# Patient Record
Sex: Female | Born: 1965 | Race: Black or African American | Hispanic: No | Marital: Married | Smoking: Never smoker
Health system: Southern US, Community
[De-identification: ages and names within clinical notes are randomized; demographics above are authoritative.]

## PROBLEM LIST (undated history)

## (undated) DIAGNOSIS — I10 Essential (primary) hypertension: Secondary | ICD-10-CM

## (undated) HISTORY — PX: TONSILLECTOMY: SUR1361

## (undated) HISTORY — PX: APPENDECTOMY: SHX54

---

## 2008-12-17 ENCOUNTER — Emergency Department (HOSPITAL_COMMUNITY): Admission: EM | Admit: 2008-12-17 | Discharge: 2008-12-17 | Payer: Self-pay | Admitting: Emergency Medicine

## 2010-05-30 ENCOUNTER — Emergency Department (HOSPITAL_BASED_OUTPATIENT_CLINIC_OR_DEPARTMENT_OTHER)
Admission: EM | Admit: 2010-05-30 | Discharge: 2010-05-30 | Disposition: A | Discharge status: Home / Self Care | Payer: Self-pay | Attending: Emergency Medicine | Admitting: Emergency Medicine

## 2010-05-30 ENCOUNTER — Emergency Department (INDEPENDENT_AMBULATORY_CARE_PROVIDER_SITE_OTHER): Payer: Self-pay

## 2010-05-30 DIAGNOSIS — R6883 Chills (without fever): Secondary | ICD-10-CM

## 2010-05-30 DIAGNOSIS — R0989 Other specified symptoms and signs involving the circulatory and respiratory systems: Secondary | ICD-10-CM

## 2010-05-30 DIAGNOSIS — J069 Acute upper respiratory infection, unspecified: Secondary | ICD-10-CM | POA: Insufficient documentation

## 2010-05-30 DIAGNOSIS — R509 Fever, unspecified: Secondary | ICD-10-CM | POA: Insufficient documentation

## 2010-05-30 DIAGNOSIS — R05 Cough: Secondary | ICD-10-CM

## 2010-05-30 DIAGNOSIS — I10 Essential (primary) hypertension: Secondary | ICD-10-CM | POA: Insufficient documentation

## 2010-05-30 LAB — URINALYSIS, ROUTINE W REFLEX MICROSCOPIC
Bilirubin Urine: NEGATIVE
Glucose, UA: NEGATIVE mg/dL
Specific Gravity, Urine: 1.018 (ref 1.005–1.030)
pH: 6 (ref 5.0–8.0)

## 2010-05-30 LAB — URINE MICROSCOPIC-ADD ON

## 2013-07-05 ENCOUNTER — Emergency Department (HOSPITAL_BASED_OUTPATIENT_CLINIC_OR_DEPARTMENT_OTHER)
Admission: EM | Admit: 2013-07-05 | Discharge: 2013-07-05 | Disposition: A | Discharge status: Home / Self Care | Payer: BC Managed Care – PPO | Attending: Emergency Medicine | Admitting: Emergency Medicine

## 2013-07-05 ENCOUNTER — Encounter (HOSPITAL_BASED_OUTPATIENT_CLINIC_OR_DEPARTMENT_OTHER): Payer: Self-pay | Admitting: Emergency Medicine

## 2013-07-05 ENCOUNTER — Emergency Department (HOSPITAL_BASED_OUTPATIENT_CLINIC_OR_DEPARTMENT_OTHER): Payer: BC Managed Care – PPO

## 2013-07-05 DIAGNOSIS — R51 Headache: Secondary | ICD-10-CM | POA: Insufficient documentation

## 2013-07-05 DIAGNOSIS — Z79899 Other long term (current) drug therapy: Secondary | ICD-10-CM | POA: Insufficient documentation

## 2013-07-05 DIAGNOSIS — R0789 Other chest pain: Secondary | ICD-10-CM

## 2013-07-05 DIAGNOSIS — R509 Fever, unspecified: Secondary | ICD-10-CM | POA: Insufficient documentation

## 2013-07-05 DIAGNOSIS — R071 Chest pain on breathing: Secondary | ICD-10-CM | POA: Insufficient documentation

## 2013-07-05 DIAGNOSIS — I10 Essential (primary) hypertension: Secondary | ICD-10-CM | POA: Insufficient documentation

## 2013-07-05 DIAGNOSIS — J209 Acute bronchitis, unspecified: Secondary | ICD-10-CM | POA: Insufficient documentation

## 2013-07-05 HISTORY — DX: Essential (primary) hypertension: I10

## 2013-07-05 MED ORDER — PHENYLEPH-PROMETHAZINE-COD 5-6.25-10 MG/5ML PO SYRP: 5.0000 mL | ORAL_SOLUTION | Freq: Three times a day (TID) | ORAL | Status: DC

## 2013-07-05 MED ORDER — AZITHROMYCIN 250 MG PO TABS: 250.0000 mg | ORAL_TABLET | Freq: Every day | ORAL | Status: DC

## 2013-07-05 NOTE — ED Notes (Signed)
Pt denies HA or dizziness. Sts she has not had her BP med yet today but will take it.

## 2013-07-05 NOTE — Discharge Instructions (Signed)
Acute Bronchitis °Bronchitis is inflammation of the airways that extend from the windpipe into the lungs (bronchi). The inflammation often causes mucus to develop. This leads to a cough, which is the most common symptom of bronchitis.  °In acute bronchitis, the condition usually develops suddenly and goes away over time, usually in a couple weeks. Smoking, allergies, and asthma can make bronchitis worse. Repeated episodes of bronchitis may cause further lung problems.  °CAUSES °Acute bronchitis is most often caused by the same virus that causes a cold. The virus can spread from person to person (contagious).  °SIGNS AND SYMPTOMS  °· Cough.   °· Fever.   °· Coughing up mucus.   °· Body aches.   °· Chest congestion.   °· Chills.   °· Shortness of breath.   °· Sore throat.   °DIAGNOSIS  °Acute bronchitis is usually diagnosed through a physical exam. Tests, such as chest X-rays, are sometimes done to rule out other conditions.  °TREATMENT  °Acute bronchitis usually goes away in a couple weeks. Often times, no medical treatment is necessary. Medicines are sometimes given for relief of fever or cough. Antibiotics are usually not needed but may be prescribed in certain situations. In some cases, an inhaler may be recommended to help reduce shortness of breath and control the cough. A cool mist vaporizer may also be used to help thin bronchial secretions and make it easier to clear the chest.  °HOME CARE INSTRUCTIONS °· Get plenty of rest.   °· Drink enough fluids to keep your urine clear or pale yellow (unless you have a medical condition that requires fluid restriction). Increasing fluids may help thin your secretions and will prevent dehydration.   °· Only take over-the-counter or prescription medicines as directed by your health care provider.   °· Avoid smoking and secondhand smoke. Exposure to cigarette smoke or irritating chemicals will make bronchitis worse. If you are a smoker, consider using nicotine gum or skin  patches to help control withdrawal symptoms. Quitting smoking will help your lungs heal faster.   °· Reduce the chances of another bout of acute bronchitis by washing your hands frequently, avoiding people with cold symptoms, and trying not to touch your hands to your mouth, nose, or eyes.   °· Follow up with your health care provider as directed.   °SEEK MEDICAL CARE IF: °Your symptoms do not improve after 1 week of treatment.  °SEEK IMMEDIATE MEDICAL CARE IF: °· You develop an increased fever or chills.   °· You have chest pain.   °· You have severe shortness of breath. °· You have bloody sputum.   °· You develop dehydration. °· You develop fainting. °· You develop repeated vomiting. °· You develop a severe headache. °MAKE SURE YOU:  °· Understand these instructions. °· Will watch your condition. °· Will get help right away if you are not doing well or get worse. °Document Released: 01/28/2004 Document Revised: 08/22/2012 Document Reviewed: 06/12/2012 °[employer]® Patient Information ©2015 [employer]. This information is not intended to replace advice given to you by your health care provider. Make sure you discuss any questions you have with your health care provider. ° °

## 2013-07-05 NOTE — ED Provider Notes (Signed)
CSN: [id number]     Arrival date & time 07/05/13  1129 History   First MD Initiated Contact with Patient 07/05/13 1233     Chief Complaint  Patient presents with  . Chest Pain     (Consider location/radiation/quality/duration/timing/severity/associated sxs/prior Treatment) Patient is a 48 y.o. female presenting with chest pain. The history is provided by the patient.  Chest Pain Pain location:  L chest Pain quality: aching and sharp   Pain radiates to:  Does not radiate Pain radiates to the back: no   Onset quality:  Sudden Duration:  6 hours Timing:  Intermittent Progression:  Unchanged Chronicity:  New Context: movement   Relieved by:  None tried Worsened by:  Deep breathing, movement and certain positions Ineffective treatments:  None tried Associated symptoms: cough, fatigue, fever and headache   Associated symptoms: no abdominal pain, no altered mental status, no anorexia, no anxiety, no back pain, no claudication, no diaphoresis, no dizziness, no dysphagia, no heartburn, no nausea, no near-syncope, no numbness, no palpitations, not vomiting and no weakness  Shortness of breath: with cough.   Risk factors: hypertension   Risk factors: no birth control, no coronary artery disease, no diabetes mellitus, no high cholesterol, no prior DVT/PE and no smoking    Gar GibbonYvette Kinser is a 48 y.o. female who presents to the ED with cough, congestion and chest pain. The cough and congestion started 5 days ago and has gotten worse. Today she noted chest pain that is worse with cough, movement and deep breath. She reports a lot of sinus congestion.  Past Medical History  Diagnosis Date  . Hypertension    Past Surgical History  Procedure Laterality Date  . Appendectomy    . Tonsillectomy     No family history on file. History  Substance Use Topics  . Smoking status: Never Smoker   . Smokeless tobacco: Not on file  . Alcohol Use: No   OB History   Grav Para Term Preterm Abortions TAB  SAB Ect Mult Living                 Review of Systems  Constitutional: Positive for fever and fatigue. Negative for diaphoresis.  HENT: Negative for trouble swallowing.   Respiratory: Positive for cough. Shortness of breath: with cough.   Cardiovascular: Positive for chest pain. Negative for palpitations, claudication and near-syncope.  Gastrointestinal: Negative for heartburn, nausea, vomiting, abdominal pain and anorexia.  Musculoskeletal: Negative for back pain.  Neurological: Positive for headaches. Negative for dizziness, weakness and numbness.      Allergies  Review of patient's allergies indicates no known allergies.  Home Medications   Prior to Admission medications   Medication Sig Start Date End Date Taking? Authorizing Provider  lisinopril-hydrochlorothiazide (PRINZIDE,ZESTORETIC) 10-12.5 MG per tablet Take 1 tablet by mouth daily.   Yes Historical Provider, MD   BP 221/89  Pulse 80  Temp(Src) 98.2 F (36.8 C) (Oral)  Resp 18  Ht 5\' 7"  (1.702 m)  Wt 203 lb (92.08 kg)  BMI 31.79 kg/m2  SpO2 98% Physical Exam  Nursing note and vitals reviewed. Constitutional: She is oriented to person, place, and time. She appears well-developed and well-nourished.  HENT:  Head: Normocephalic.  Right Ear: Tympanic membrane is retracted.  Left Ear: Tympanic membrane is retracted.  Nose: Right sinus exhibits maxillary sinus tenderness. Left sinus exhibits maxillary sinus tenderness.  Mouth/Throat: Uvula is midline, oropharynx is clear and moist and mucous membranes are normal.  Eyes: Conjunctivae and EOM are  normal. Pupils are equal, round, and reactive to light.  Neck: Neck supple.  Cardiovascular: Normal rate, regular rhythm and normal heart sounds.   Pulmonary/Chest: Effort normal. She has decreased breath sounds. She has no wheezes. She has no rales. She exhibits tenderness. She exhibits no mass and no crepitus.    Tender with palpation over the left anterior rib area.     Abdominal: Soft. There is no tenderness.  Musculoskeletal: Normal range of motion.  Neurological: She is alert and oriented to person, place, and time. No cranial nerve deficit.  Skin: Skin is warm and dry.  Psychiatric: She has a normal mood and affect. Her behavior is normal.    ED Course: discussed with Dr. Anitra LauthPlunkett  Procedures  Dg Chest 2 View  07/05/2013   CLINICAL DATA:  Left chest pain and mild shortness of breath.  EXAM: CHEST  2 VIEW  COMPARISON:  05/30/2010.  FINDINGS: Poor inspiration. Borderline enlarged cardiac silhouette, magnified by the poor inspiration. Clear lungs with normal vascularity. Mild central peribronchial thickening with mild progression. Mild scoliosis.  IMPRESSION: Mild bronchitic changes with mild progression.   Electronically Signed   By: Gordan PaymentSteve  Reid M.D.   On: 07/05/2013 13:13   EKG Interpretation  Date/Time:  Friday July 05 2013 11:41:06 EDT Ventricular Rate:  66 PR Interval:  188 QRS Duration: 82 QT Interval:  466 QTC Calculation: 488 R Axis:   -22 Text Interpretation:  Normal sinus rhythm Minimal voltage criteria for LVH, may be normal variant Prolonged QT No previous tracing Confirmed by Anitra LauthPLUNKETT  MD, WHITNEY ([id number]) on 07/05/2013 11:45:19 AM .  MDM  48 y.o. female with chest wall pain after having cough and congestion past several days. Stable for discharge without increased risk for PE, no previous hx of PE or DVT, no OC's, nonsmoker, no recent travels. Will treat for bronchitis and chest wall pain. She will return if symptoms worsen.  Discussed with the patient and all questioned fully answered.    Medication List    TAKE these medications       azithromycin 250 MG tablet  Commonly known as:  ZITHROMAX  Take 1 tablet (250 mg total) by mouth daily. Take first 2 tablets together, then 1 every day until finished.     Phenyleph-Promethazine-Cod 5-6.25-10 MG/5ML Syrp  Take 5 mLs by mouth 3 (three) times daily.      ASK your doctor about these  medications       lisinopril-hydrochlorothiazide 10-12.5 MG per tablet  Commonly known as:  PRINZIDE,ZESTORETIC  Take 1 tablet by mouth daily.          [facility] Orlene OchM Neese, [place]NP 07/06/13 [phone]

## 2013-07-05 NOTE — ED Notes (Signed)
Pt c/o a pulling sensation in her left chest that began this morning. Pain is worse with movement and inspiration. Some SOB but pt has had sinus congestion and pressure for several days also.

## 2013-07-07 NOTE — ED Provider Notes (Signed)
Medical screening examination/treatment/procedure(s) were performed by non-physician practitioner and as supervising physician I was immediately available for consultation/collaboration.   EKG Interpretation   Date/Time:  Friday July 05 2013 11:41:06 EDT Ventricular Rate:  66 PR Interval:  188 QRS Duration: 82 QT Interval:  466 QTC Calculation: 488 R Axis:   -22 Text Interpretation:  Normal sinus rhythm Minimal voltage criteria for  LVH, may be normal variant Prolonged QT No previous tracing Confirmed by  Anitra LauthPLUNKETT  MD, Maniah Nading ([id number]) on 07/05/2013 11:45:19 AM        Gwyneth SproutWhitney Marcellino Fidalgo, MD 07/07/13 1203

## 2016-12-13 ENCOUNTER — Other Ambulatory Visit: Payer: Self-pay

## 2016-12-13 ENCOUNTER — Emergency Department (HOSPITAL_BASED_OUTPATIENT_CLINIC_OR_DEPARTMENT_OTHER)
Admission: EM | Admit: 2016-12-13 | Discharge: 2016-12-13 | Disposition: A | Discharge status: Home / Self Care | Payer: Self-pay | Attending: Emergency Medicine | Admitting: Emergency Medicine

## 2016-12-13 ENCOUNTER — Encounter (HOSPITAL_BASED_OUTPATIENT_CLINIC_OR_DEPARTMENT_OTHER): Payer: Self-pay

## 2016-12-13 DIAGNOSIS — I10 Essential (primary) hypertension: Secondary | ICD-10-CM | POA: Insufficient documentation

## 2016-12-13 DIAGNOSIS — J069 Acute upper respiratory infection, unspecified: Secondary | ICD-10-CM | POA: Insufficient documentation

## 2016-12-13 LAB — RAPID STREP SCREEN (MED CTR MEBANE ONLY): Streptococcus, Group A Screen (Direct): NEGATIVE

## 2016-12-13 MED ORDER — KETOROLAC TROMETHAMINE 30 MG/ML IJ SOLN
30.0000 mg | Freq: Once | INTRAMUSCULAR | Status: AC
  Administered 2016-12-13: 30 mg via INTRAMUSCULAR
  Filled 2016-12-13: qty 1

## 2016-12-13 NOTE — ED Provider Notes (Signed)
[facility] EMERGENCY DEPARTMENT Provider Note   CSN: [id number] Arrival date & time: 12/13/16  1559     History   Chief Complaint Chief Complaint  Patient presents with  . Nasal Congestion    HPI Gar GibbonYvette Lowe is a 51 y.o. female.  Patient reports nasal congestion, sinus pressure, malaise, intermittent fever since Friday. She has tried OTC meds without significant relief. She has been around several sick contacts at school.   The history is provided by the patient. No language interpreter was used.  URI   This is a new problem. The current episode started more than 2 days ago. The problem has been gradually worsening. The maximum temperature recorded prior to her arrival was 102 to 102.9 F. The fever has been present for 1 to 2 days. Associated symptoms include diarrhea, nausea, congestion, sinus pain and sore throat. Pertinent negatives include no rash. She has tried NSAIDs and steam for the symptoms. The treatment provided no relief.    Past Medical History:  Diagnosis Date  . Hypertension     There are no active problems to display for this patient.   Past Surgical History:  Procedure Laterality Date  . APPENDECTOMY    . TONSILLECTOMY      OB History    No data available       Home Medications    Prior to Admission medications   Medication Sig Start Date End Date Taking? Authorizing Provider  lisinopril-hydrochlorothiazide (PRINZIDE,ZESTORETIC) 10-12.5 MG per tablet Take 1 tablet by mouth daily.    [provider]    Family History No family history on file.  Social History Social History   Tobacco Use  . Smoking status: Never Smoker  . Smokeless tobacco: Never Used  Substance Use Topics  . Alcohol use: No  . Drug use: No     Allergies   Patient has no known allergies.   Review of Systems Review of Systems  HENT: Positive for congestion, sinus pain and sore throat.   Gastrointestinal: Positive for diarrhea and nausea.   Musculoskeletal: Positive for myalgias.  Skin: Negative for rash.  All other systems reviewed and are negative.    Physical Exam Updated Vital Signs BP (!) 171/103 (BP Location: Right Arm) Comment (BP Location): Has not taken her bp med today.  Has taken cold medicines.  Pulse 70   Temp 98.3 F (36.8 C) (Oral)   Resp 20   Ht 5\' 7"  (1.702 m)   Wt 91.2 kg (201 lb)   SpO2 98%   BMI 31.48 kg/m   Physical Exam  Constitutional: She is oriented to person, place, and time. She appears well-developed and well-nourished.  HENT:  Right Ear: Tympanic membrane normal.  Left Ear: Tympanic membrane is erythematous.  Mouth/Throat: Uvula is midline and mucous membranes are normal. Posterior oropharyngeal erythema present. No tonsillar exudate.  Eyes: Conjunctivae are normal.  Neck: Neck supple.  Cardiovascular: Normal rate and regular rhythm.  Pulmonary/Chest: Effort normal and breath sounds normal.  Abdominal: Soft. Bowel sounds are normal.  Musculoskeletal: Normal range of motion.  Lymphadenopathy:    She has no cervical adenopathy.  Neurological: She is alert and oriented to person, place, and time.  Skin: Skin is warm and dry.  Psychiatric: She has a normal mood and affect.  Nursing note and vitals reviewed.    ED Treatments / Results  Labs (all labs ordered are listed, but only abnormal results are displayed) Labs Reviewed  RAPID STREP SCREEN (NOT AT [facility])  CULTURE, GROUP A STREP [facility])    EKG  EKG Interpretation None       Radiology No results found.  Procedures Procedures (including critical care time)  Medications Ordered in ED Medications  ketorolac (TORADOL) 30 MG/ML injection 30 mg (not administered)     Initial Impression / Assessment and Plan / ED Course  I have reviewed the triage vital signs and the nursing notes.  Pertinent labs & imaging results that were available during my care of the patient were reviewed by me and considered in my medical  decision making (see chart for details).     Pt symptoms consistent with URI. Pt will be discharged with symptomatic treatment.  Discussed return precautions.  Pt is hemodynamically stable & in NAD prior to discharge.  Final Clinical Impressions(s) / ED Diagnoses   Final diagnoses:  Viral upper respiratory tract infection    ED Discharge Orders    None       Felicie MornSmith, Addalyn Speedy, NP 12/13/16 2109    Gwyneth SproutPlunkett, Whitney, MD 12/14/16 [phone]

## 2016-12-13 NOTE — ED Notes (Signed)
ED Provider at bedside. 

## 2016-12-13 NOTE — ED Triage Notes (Signed)
C/o flu like sx x 5 days-no relief with OTC meds-NAD-steady gait

## 2016-12-13 NOTE — Discharge Instructions (Signed)
Your strep screen was negative. A confirmatory culture will be completed, with results in 2-3 days. You will be contacted if the culture indicates additional treatment is needed. Please refer to the attached discharge instructions. You may also want to try saline nasal spray to help with the sinus congestion.

## 2016-12-16 LAB — CULTURE, GROUP A STREP (THRC)

## 2017-05-11 ENCOUNTER — Other Ambulatory Visit: Payer: Self-pay

## 2017-05-11 ENCOUNTER — Encounter (HOSPITAL_BASED_OUTPATIENT_CLINIC_OR_DEPARTMENT_OTHER): Payer: Self-pay

## 2017-05-11 DIAGNOSIS — I10 Essential (primary) hypertension: Secondary | ICD-10-CM | POA: Insufficient documentation

## 2017-05-11 DIAGNOSIS — N3000 Acute cystitis without hematuria: Secondary | ICD-10-CM | POA: Diagnosis not present

## 2017-05-11 DIAGNOSIS — R300 Dysuria: Secondary | ICD-10-CM | POA: Diagnosis present

## 2017-05-11 LAB — URINALYSIS, ROUTINE W REFLEX MICROSCOPIC
BILIRUBIN URINE: NEGATIVE
GLUCOSE, UA: NEGATIVE mg/dL
Ketones, ur: NEGATIVE mg/dL
Nitrite: POSITIVE — AB
PROTEIN: NEGATIVE mg/dL
Specific Gravity, Urine: 1.02 (ref 1.005–1.030)
pH: 5.5 (ref 5.0–8.0)

## 2017-05-11 LAB — URINALYSIS, MICROSCOPIC (REFLEX)

## 2017-05-11 NOTE — ED Triage Notes (Signed)
C/o dysuria x 4 days-NAD-steady gait

## 2017-05-12 ENCOUNTER — Emergency Department (HOSPITAL_BASED_OUTPATIENT_CLINIC_OR_DEPARTMENT_OTHER)
Admission: EM | Admit: 2017-05-12 | Discharge: 2017-05-12 | Disposition: A | Discharge status: Home / Self Care | Payer: BLUE CROSS/BLUE SHIELD | Attending: Emergency Medicine | Admitting: Emergency Medicine

## 2017-05-12 DIAGNOSIS — N3000 Acute cystitis without hematuria: Secondary | ICD-10-CM

## 2017-05-12 LAB — CBC WITH DIFFERENTIAL/PLATELET
BASOS ABS: 0 10*3/uL (ref 0.0–0.1)
Basophils Relative: 1 %
Eosinophils Absolute: 0.2 10*3/uL (ref 0.0–0.7)
Eosinophils Relative: 2 %
HCT: 34.4 % — ABNORMAL LOW (ref 36.0–46.0)
HEMOGLOBIN: 11.6 g/dL — AB (ref 12.0–15.0)
LYMPHS PCT: 29 %
Lymphs Abs: 2.2 10*3/uL (ref 0.7–4.0)
MCH: 26.7 pg (ref 26.0–34.0)
MCHC: 33.7 g/dL (ref 30.0–36.0)
MCV: 79.1 fL (ref 78.0–100.0)
Monocytes Absolute: 0.7 10*3/uL (ref 0.1–1.0)
Monocytes Relative: 9 %
NEUTROS ABS: 4.5 10*3/uL (ref 1.7–7.7)
NEUTROS PCT: 59 %
Platelets: 293 10*3/uL (ref 150–400)
RBC: 4.35 MIL/uL (ref 3.87–5.11)
RDW: 14.3 % (ref 11.5–15.5)
WBC: 7.6 10*3/uL (ref 4.0–10.5)

## 2017-05-12 LAB — BASIC METABOLIC PANEL
ANION GAP: 8 (ref 5–15)
BUN: 21 mg/dL — ABNORMAL HIGH (ref 6–20)
CO2: 26 mmol/L (ref 22–32)
Calcium: 9.4 mg/dL (ref 8.9–10.3)
Chloride: 104 mmol/L (ref 101–111)
Creatinine, Ser: 0.7 mg/dL (ref 0.44–1.00)
GLUCOSE: 91 mg/dL (ref 65–99)
POTASSIUM: 3.5 mmol/L (ref 3.5–5.1)
Sodium: 138 mmol/L (ref 135–145)

## 2017-05-12 MED ORDER — SODIUM CHLORIDE 0.9 % IV SOLN
1.0000 g | Freq: Once | INTRAVENOUS | Status: AC
  Administered 2017-05-12: 1 g via INTRAVENOUS
  Filled 2017-05-12: qty 10

## 2017-05-12 MED ORDER — ACETAMINOPHEN 325 MG PO TABS
650.0000 mg | ORAL_TABLET | Freq: Once | ORAL | Status: AC
  Administered 2017-05-12: 650 mg via ORAL
  Filled 2017-05-12: qty 2

## 2017-05-12 MED ORDER — CEPHALEXIN 500 MG PO CAPS: 500.0000 mg | ORAL_CAPSULE | Freq: Four times a day (QID) | ORAL | 0 refills | Status: AC

## 2017-05-12 NOTE — ED Notes (Signed)
ED provider at the bedside.  

## 2017-05-12 NOTE — ED Provider Notes (Signed)
[facility] EMERGENCY DEPARTMENT Provider Note   CSN: [id number] Arrival date & time: 05/11/17  2252     History   Chief Complaint Chief Complaint  Patient presents with  . Dysuria    HPI Tina Lowe is a 52 y.o. female.  The history is provided by the patient.  Dysuria   This is a new problem. The current episode started more than 2 days ago. The problem occurs every urination. The problem has been gradually worsening. The quality of the pain is described as burning. The pain is moderate. The maximum temperature recorded prior to her arrival was 102 to 102.9 F. Associated symptoms include nausea. Pertinent negatives include no vomiting, no discharge and no flank pain. Treatments tried: AZO. Her past medical history does not include kidney stones, urological procedure or recurrent UTIs.   Patient reports onset of dysuria about 4 days ago.  She reports it is worsening.  She reports she originally had suprapubic pain, now it is worsening.  She reports fever today. She is never had a UTI before.  She has tried Azo, without relief Past Medical History:  Diagnosis Date  . Hypertension     There are no active problems to display for this patient.   Past Surgical History:  Procedure Laterality Date  . APPENDECTOMY    . TONSILLECTOMY       OB History   None      Home Medications    Prior to Admission medications   Medication Sig Start Date End Date Taking? Authorizing Provider  lisinopril-hydrochlorothiazide (PRINZIDE,ZESTORETIC) 10-12.5 MG per tablet Take 1 tablet by mouth daily.    [provider]    Family History No family history on file.  Social History Social History   Tobacco Use  . Smoking status: Never Smoker  . Smokeless tobacco: Never Used  Substance Use Topics  . Alcohol use: No  . Drug use: No     Allergies   Patient has no known allergies.   Review of Systems Review of Systems  Constitutional: Positive for fever.    Gastrointestinal: Positive for nausea. Negative for vomiting.  Genitourinary: Positive for dysuria. Negative for flank pain, vaginal bleeding and vaginal discharge.  All other systems reviewed and are negative.    Physical Exam Updated Vital Signs BP (!) 205/104 (BP Location: Left Arm)   Pulse 85   Temp 99.2 F (37.3 C) (Oral)   Resp 20   Ht 1.676 m ( )   Wt 93.4 kg (206 lb)   SpO2 98%   BMI 33.25 kg/m   Physical Exam CONSTITUTIONAL: Well developed/well nourished HEAD: Normocephalic/atraumatic EYES: EOMI/PERRL ENMT: Mucous membranes moist NECK: supple no meningeal signs SPINE/BACK:entire spine nontender CV: S1/S2 noted, no murmurs/rubs/gallops noted LUNGS: Lungs are clear to auscultation bilaterally, no apparent distress ABDOMEN: soft, diffuse mild tenderness, moderate suprapubic tenderness, no rebound or guarding, bowel sounds noted throughout abdomen GU:no cva tenderness NEURO: Pt is awake/alert/appropriate, moves all extremitiesx4.  No facial droop.   EXTREMITIES: pulses normal/equal, full ROM SKIN: warm, color normal PSYCH: no abnormalities of mood noted, alert and oriented to situation   ED Treatments / Results  Labs (all labs ordered are listed, but only abnormal results are displayed) Labs Reviewed  URINALYSIS, ROUTINE W REFLEX MICROSCOPIC - Abnormal; Notable for the following components:      Result Value   APPearance CLOUDY (*)    Hgb urine dipstick MODERATE (*)    Nitrite POSITIVE (*)    Leukocytes, UA SMALL (*)  All other components within normal limits  URINALYSIS, MICROSCOPIC (REFLEX) - Abnormal; Notable for the following components:   Bacteria, UA MANY (*)    All other components within normal limits  BASIC METABOLIC PANEL - Abnormal; Notable for the following components:   BUN 21 (*)    All other components within normal limits  CBC WITH DIFFERENTIAL/PLATELET - Abnormal; Notable for the following components:   Hemoglobin 11.6 (*)    HCT  34.4 (*)    All other components within normal limits    EKG None  Radiology No results found.  Procedures Procedures    Medications Ordered in ED Medications  acetaminophen (TYLENOL) tablet 650 mg (650 mg Oral Given 05/12/17 0300)  cefTRIAXone (ROCEPHIN) 1 g in sodium chloride 0.9 % 100 mL IVPB (0 g Intravenous Stopped 05/12/17 0336)     Initial Impression / Assessment and Plan / ED Course  I have reviewed the triage vital signs and the nursing notes.  Pertinent labs  results that were available during my care of the patient were reviewed by me and considered in my medical decision making (see chart for details).     2:57 AM Patient with UTI, now reporting fever and pain.  Will load with IV Rocephin, and check labs.  She requests Tylenol for pain 4:16 AM Patient improved, vitals improved, labs reassuring She now only has mild suprapubic  tenderness, no other focal tenderness Will treat for UTI. Discussed Strict return precautions Final Clinical Impressions(s) / ED Diagnoses   Final diagnoses:  Acute cystitis without hematuria    ED Discharge Orders        Ordered    cephALEXin (KEFLEX) 500 MG capsule  4 times daily     05/12/17 0411       Zadie Rhine, MD 05/12/17 [phone]

## 2017-11-05 ENCOUNTER — Emergency Department (HOSPITAL_BASED_OUTPATIENT_CLINIC_OR_DEPARTMENT_OTHER): Payer: BLUE CROSS/BLUE SHIELD

## 2017-11-05 ENCOUNTER — Emergency Department (HOSPITAL_BASED_OUTPATIENT_CLINIC_OR_DEPARTMENT_OTHER)
Admission: EM | Admit: 2017-11-05 | Discharge: 2017-11-05 | Disposition: A | Discharge status: Home / Self Care | Payer: BLUE CROSS/BLUE SHIELD | Attending: Emergency Medicine | Admitting: Emergency Medicine

## 2017-11-05 ENCOUNTER — Encounter (HOSPITAL_BASED_OUTPATIENT_CLINIC_OR_DEPARTMENT_OTHER): Payer: Self-pay | Admitting: Emergency Medicine

## 2017-11-05 ENCOUNTER — Other Ambulatory Visit: Payer: Self-pay

## 2017-11-05 DIAGNOSIS — R079 Chest pain, unspecified: Secondary | ICD-10-CM | POA: Diagnosis not present

## 2017-11-05 DIAGNOSIS — I10 Essential (primary) hypertension: Secondary | ICD-10-CM | POA: Insufficient documentation

## 2017-11-05 DIAGNOSIS — R0602 Shortness of breath: Secondary | ICD-10-CM | POA: Diagnosis not present

## 2017-11-05 DIAGNOSIS — Z79899 Other long term (current) drug therapy: Secondary | ICD-10-CM | POA: Diagnosis not present

## 2017-11-05 DIAGNOSIS — R0789 Other chest pain: Secondary | ICD-10-CM | POA: Insufficient documentation

## 2017-11-05 LAB — CBC
HCT: 37.6 % (ref 36.0–46.0)
Hemoglobin: 11.7 g/dL — ABNORMAL LOW (ref 12.0–15.0)
MCH: 25.4 pg — ABNORMAL LOW (ref 26.0–34.0)
MCHC: 31.1 g/dL (ref 30.0–36.0)
MCV: 81.6 fL (ref 80.0–100.0)
NRBC: 0 % (ref 0.0–0.2)
PLATELETS: 323 10*3/uL (ref 150–400)
RBC: 4.61 MIL/uL (ref 3.87–5.11)
RDW: 14.2 % (ref 11.5–15.5)
WBC: 5.5 10*3/uL (ref 4.0–10.5)

## 2017-11-05 LAB — TROPONIN I: Troponin I: <0.03 ng/mL (ref <0.03)

## 2017-11-05 LAB — BASIC METABOLIC PANEL
Anion gap: 10 (ref 5–15)
BUN: 20 mg/dL (ref 6–20)
CALCIUM: 10.3 mg/dL (ref 8.9–10.3)
CHLORIDE: 103 mmol/L (ref 98–111)
CO2: 27 mmol/L (ref 22–32)
Creatinine, Ser: 0.8 mg/dL (ref 0.44–1.00)
GFR calc Af Amer: >60 mL/min (ref >60)
GFR calc non Af Amer: >60 mL/min (ref >60)
Glucose, Bld: 98 mg/dL (ref 70–99)
POTASSIUM: 3.7 mmol/L (ref 3.5–5.1)
Sodium: 140 mmol/L (ref 135–145)

## 2017-11-05 MED ORDER — KETOROLAC TROMETHAMINE 30 MG/ML IJ SOLN
15.0000 mg | Freq: Once | INTRAMUSCULAR | Status: AC
  Administered 2017-11-05: 15 mg via INTRAVENOUS
  Filled 2017-11-05: qty 1

## 2017-11-05 MED ORDER — IBUPROFEN 600 MG PO TABS: 600.0000 mg | ORAL_TABLET | Freq: Four times a day (QID) | ORAL | 0 refills | Status: AC | PRN

## 2017-11-05 NOTE — ED Provider Notes (Signed)
[facility] EMERGENCY DEPARTMENT Provider Note   CSN: [id number] Arrival date & time: 11/05/17  0827     History   Chief Complaint Chief Complaint  Patient presents with  . Chest Pain    HPI Tina Lowe is a 52 y.o. female.  HPI 52 year old white female comes in with chief complaint of chest pain. Patient has history of hypertension.  She reports that upon her waking up she noted left-sided substernal chest pain.  Her pain is constant and worse with movement.  The pain is described as " something is pulling".  Patient also states that the pain is worse with deep inspiration and she has mild shortness of breath.   Patient denies any history of coronary artery disease.  She is not a smoker and does not use any drugs.  There is no family history of CAD. Pt has no hx of PE, DVT and denies any exogenous hormone (testosterone / estrogen) use, long distance travels or surgery in the past 6 weeks, active cancer, recent immobilization.   Past Medical History:  Diagnosis Date  . Hypertension     There are no active problems to display for this patient.   Past Surgical History:  Procedure Laterality Date  . APPENDECTOMY    . TONSILLECTOMY       OB History   None      Home Medications    Prior to Admission medications   Medication Sig Start Date End Date Taking? Authorizing Provider  cephALEXin (KEFLEX) 500 MG capsule Take 1 capsule (500 mg total) by mouth 4 (four) times daily. 05/12/17   Zadie Rhine, MD  lisinopril-hydrochlorothiazide (PRINZIDE,ZESTORETIC) 10-12.5 MG per tablet Take 1 tablet by mouth daily.    [provider]    Family History No family history on file.  Social History Social History   Tobacco Use  . Smoking status: Never Smoker  . Smokeless tobacco: Never Used  Substance Use Topics  . Alcohol use: No  . Drug use: No     Allergies   Patient has no known allergies.   Review of Systems Review of Systems    Constitutional: Positive for activity change.  Respiratory: Positive for shortness of breath.   Cardiovascular: Positive for chest pain.  Gastrointestinal: Negative for nausea and vomiting.  All other systems reviewed and are negative.    Physical Exam Updated Vital Signs BP (!) 189/80   Pulse 64   Temp 99.4 F (37.4 C) (Oral)   Resp 18   Ht 5\' 7"  (1.702 m)   Wt 93 kg   SpO2 100%   BMI 32.11 kg/m   Physical Exam  Constitutional: She is oriented to person, place, and time. She appears well-developed.  HENT:  Head: Normocephalic and atraumatic.  Eyes: EOM are normal.  Neck: Normal range of motion. Neck supple.  Cardiovascular: Normal rate, intact distal pulses and normal pulses.  Patient's chest wall tenderness is reproduced with forward flexion, pec fly and palpation of the substernal region.  Pulmonary/Chest: Effort normal. No tachypnea. No respiratory distress.  Abdominal: Bowel sounds are normal.  Neurological: She is alert and oriented to person, place, and time.  Skin: Skin is warm and dry.  Nursing note and vitals reviewed.    ED Treatments / Results  Labs (all labs ordered are listed, but only abnormal results are displayed) Labs Reviewed  CBC - Abnormal; Notable for the following components:      Result Value   Hemoglobin 11.7 (*)  MCH 25.4 (*)    All other components within normal limits  BASIC METABOLIC PANEL  TROPONIN I    EKG EKG Interpretation  Date/Time:  Sunday November 05 2017 08:34:47 EST Ventricular Rate:  84 PR Interval:    QRS Duration: 94 QT Interval:  421 QTC Calculation: 498 R Axis:   -14 Text Interpretation:  Sinus rhythm Borderline prolonged QT interval No acute changes Confirmed by Lynzee Lindquist ([id number]) on 11/05/2017 9:24:13 AM   Radiology Dg Chest 2 View  Result Date: 11/05/2017 CLINICAL DATA:  Pain under left breast since yesterday.  Chest pain. EXAM: CHEST - 2 VIEW COMPARISON:  Two-view chest x-ray 07/05/2013 FINDINGS:  The heart size is exaggerated by low lung volumes. Atherosclerotic changes are present at the aortic arch. There is no edema or effusion. No focal airspace disease is present. The visualized soft tissues and bony thorax are unremarkable. IMPRESSION: 1. Acute cardiopulmonary disease. 2. Low lung volumes. 3. Aortic atherosclerosis. Electronically Signed   By: Christopher  Mattern M.D.   On: 11/05/2017 09:15    Procedures Procedures (including critical care time)  Medications Ordered in ED Medications  ketorolac (TORADOL) 30 MG/ML injection 15 mg (has no administration in time range)     Initial Impression / Assessment and Plan / ED Course  I have reviewed the triage vital signs and the nursing notes.  Pertinent labs & imaging results that were available during my care of the patient were reviewed by me and considered in my medical decision making (see chart for details).     52  year old female comes in with chief complaint of chest pain.  History is consistent with what appears to be musculoskeletal or pleuritic chest pain. Delta troponin ordered to rule out ACS.  EKG is not showing any acute findings and the heart score is 2.  Given that the pain is reproducible with palpation and movement of the upper extremities, we will treat this pain conservatively as chest wall pain.  Patient has been made aware that pulmonary embolism can also produce pleuritic type pain, and that if her symptoms are getting worse despite anti-inflammatory medications and she will have to return to the ER.  Patient is in agreement with the plan for conservative management for now.   Final Clinical Impressions(s) / ED Diagnoses   Final diagnoses:  Chest wall pain  Nonspecific chest pain    ED Discharge Orders    None       Derwood Kaplan, MD 11/05/17 1025

## 2017-11-05 NOTE — ED Notes (Signed)
Pt/family verbalized understanding of discharge instructions.   

## 2017-11-05 NOTE — ED Triage Notes (Signed)
Pain under her L breast since yesterday. She states it feels like a pulled muscle, worse with movement

## 2017-11-05 NOTE — Discharge Instructions (Signed)
We saw you in the ER for the chest pain. All of our cardiac workup is normal, including labs, EKG and chest X-RAY are normal. We are not sure what is causing your discomfort, but we feel comfortable sending you home at this time. The workup in the ER is not complete, and you should follow up with your primary care doctor for further evaluation.  

## 2018-10-11 ENCOUNTER — Other Ambulatory Visit: Payer: Self-pay

## 2018-10-11 DIAGNOSIS — Z20822 Contact with and (suspected) exposure to covid-19: Secondary | ICD-10-CM

## 2018-10-13 LAB — NOVEL CORONAVIRUS, NAA: SARS-CoV-2, NAA: NOT DETECTED

## 2019-03-21 ENCOUNTER — Ambulatory Visit: Payer: BC Managed Care – PPO | Attending: Family

## 2019-03-21 DIAGNOSIS — Z23 Encounter for immunization: Secondary | ICD-10-CM

## 2019-03-21 NOTE — Progress Notes (Signed)
   Covid-19 Vaccination Clinic  Name:  Curtina Grills    MRN: [id number] DOB: 04-07-1965  03/21/2019  Ms. Whistler was observed post Covid-19 immunization for 15 minutes without incident. She was provided with Vaccine Information Sheet and instruction to access the V-Safe system.   Ms. Clippinger was instructed to call 911 with any severe reactions post vaccine: Marland Kitchen Difficulty breathing  . Swelling of face and throat  . A fast heartbeat  . A bad rash all over body  . Dizziness and weakness   Immunizations Administered    Name Date Dose VIS Date Route   Moderna COVID-19 Vaccine 03/21/2019  4:25 PM 0.5 mL 12/04/2018 Intramuscular   Manufacturer: Moderna   Lot: [id number]   NDC: 58682-574-93

## 2019-04-23 ENCOUNTER — Ambulatory Visit: Payer: BC Managed Care – PPO | Attending: Family

## 2019-04-23 DIAGNOSIS — Z23 Encounter for immunization: Secondary | ICD-10-CM

## 2019-04-23 NOTE — Progress Notes (Signed)
   Covid-19 Vaccination Clinic  Name:  Humaira Sculley    MRN: [id number] DOB: [birth year]  04/23/2019  Ms. Hunke was observed post Covid-19 immunization for 15 minutes without incident. She was provided with Vaccine Information Sheet and instruction to access the V-Safe system.   Ms. Sorci was instructed to call 911 with any severe reactions post vaccine: Marland Kitchen Difficulty breathing  . Swelling of face and throat  . A fast heartbeat  . A bad rash all over body  . Dizziness and weakness   Immunizations Administered    Name Date Dose VIS Date Route   Moderna COVID-19 Vaccine 04/23/2019  4:14 PM 0.5 mL 12/2018 Intramuscular   Manufacturer: Moderna   Lot: [id number]   NDC: 53794-327-61

## 2019-06-24 ENCOUNTER — Other Ambulatory Visit: Payer: Self-pay

## 2019-06-24 ENCOUNTER — Encounter (HOSPITAL_BASED_OUTPATIENT_CLINIC_OR_DEPARTMENT_OTHER): Payer: Self-pay | Admitting: *Deleted

## 2019-06-24 ENCOUNTER — Emergency Department (HOSPITAL_BASED_OUTPATIENT_CLINIC_OR_DEPARTMENT_OTHER)
Admission: EM | Admit: 2019-06-24 | Discharge: 2019-06-24 | Disposition: A | Discharge status: Home / Self Care | Payer: BC Managed Care – PPO | Attending: Emergency Medicine | Admitting: Emergency Medicine

## 2019-06-24 ENCOUNTER — Emergency Department (HOSPITAL_BASED_OUTPATIENT_CLINIC_OR_DEPARTMENT_OTHER): Payer: BC Managed Care – PPO

## 2019-06-24 DIAGNOSIS — N201 Calculus of ureter: Secondary | ICD-10-CM | POA: Diagnosis not present

## 2019-06-24 DIAGNOSIS — R109 Unspecified abdominal pain: Secondary | ICD-10-CM | POA: Diagnosis present

## 2019-06-24 DIAGNOSIS — Z79899 Other long term (current) drug therapy: Secondary | ICD-10-CM | POA: Diagnosis not present

## 2019-06-24 DIAGNOSIS — I10 Essential (primary) hypertension: Secondary | ICD-10-CM | POA: Diagnosis not present

## 2019-06-24 LAB — CBC
HCT: 37.5 % (ref 36.0–46.0)
Hemoglobin: 11.9 g/dL — ABNORMAL LOW (ref 12.0–15.0)
MCH: 25.3 pg — ABNORMAL LOW (ref 26.0–34.0)
MCHC: 31.7 g/dL (ref 30.0–36.0)
MCV: 79.6 fL — ABNORMAL LOW (ref 80.0–100.0)
Platelets: 357 10*3/uL (ref 150–400)
RBC: 4.71 MIL/uL (ref 3.87–5.11)
RDW: 14.4 % (ref 11.5–15.5)
WBC: 15.3 10*3/uL — ABNORMAL HIGH (ref 4.0–10.5)
nRBC: 0 % (ref 0.0–0.2)

## 2019-06-24 LAB — BASIC METABOLIC PANEL
Anion gap: 14 (ref 5–15)
BUN: 30 mg/dL — ABNORMAL HIGH (ref 6–20)
CO2: 24 mmol/L (ref 22–32)
Calcium: 9.8 mg/dL (ref 8.9–10.3)
Chloride: 97 mmol/L — ABNORMAL LOW (ref 98–111)
Creatinine, Ser: 1.07 mg/dL — ABNORMAL HIGH (ref 0.44–1.00)
GFR calc Af Amer: >60 mL/min (ref >60)
GFR calc non Af Amer: 58 mL/min — ABNORMAL LOW (ref >60)
Glucose, Bld: 140 mg/dL — ABNORMAL HIGH (ref 70–99)
Potassium: 3.3 mmol/L — ABNORMAL LOW (ref 3.5–5.1)
Sodium: 135 mmol/L (ref 135–145)

## 2019-06-24 LAB — URINALYSIS, MICROSCOPIC (REFLEX): RBC / HPF: >50 RBC/hpf (ref 0–5)

## 2019-06-24 LAB — URINALYSIS, ROUTINE W REFLEX MICROSCOPIC
Bilirubin Urine: NEGATIVE
Glucose, UA: NEGATIVE mg/dL
Ketones, ur: NEGATIVE mg/dL
Nitrite: NEGATIVE
Protein, ur: NEGATIVE mg/dL
Specific Gravity, Urine: 1.02 (ref 1.005–1.030)
pH: 6 (ref 5.0–8.0)

## 2019-06-24 MED ORDER — KETOROLAC TROMETHAMINE 30 MG/ML IJ SOLN
30.0000 mg | Freq: Once | INTRAMUSCULAR | Status: AC
  Administered 2019-06-24: 30 mg via INTRAVENOUS
  Filled 2019-06-24: qty 1

## 2019-06-24 MED ORDER — HYDROMORPHONE HCL 1 MG/ML IJ SOLN
1.0000 mg | Freq: Once | INTRAMUSCULAR | Status: AC
  Administered 2019-06-24: 1 mg via INTRAVENOUS
  Filled 2019-06-24: qty 1

## 2019-06-24 MED ORDER — ONDANSETRON HCL 4 MG/2ML IJ SOLN
4.0000 mg | Freq: Once | INTRAMUSCULAR | Status: AC
  Administered 2019-06-24: 4 mg via INTRAVENOUS
  Filled 2019-06-24: qty 2

## 2019-06-24 MED ORDER — ONDANSETRON 4 MG PO TBDP: 4.0000 mg | ORAL_TABLET | Freq: Three times a day (TID) | ORAL | 1 refills | Status: DC | PRN

## 2019-06-24 MED ORDER — HYDROCODONE-ACETAMINOPHEN 5-325 MG PO TABS: 1.0000 | ORAL_TABLET | Freq: Four times a day (QID) | ORAL | 0 refills | Status: DC | PRN

## 2019-06-24 NOTE — ED Triage Notes (Signed)
C/o diffuse lower abd pain x 2 days , urianry freq

## 2019-06-24 NOTE — ED Provider Notes (Signed)
[facility] EMERGENCY DEPARTMENT Provider Note   CSN: [id number] Arrival date & time: 06/24/19  1859     History Chief Complaint  Patient presents with  . Abdominal Pain    Tina Lowe is a 54 y.o. female.  Patient with onset of some mild left flank left lower abdominal pain around noon time.  Got lot more intense around 5 PM this evening.  Nauseated with the feeling of needing to go some urinary frequency and cloudy urine.  No nausea or vomiting.  Patient without any history of kidney stones in the past.  But her mother had a lot of kidney stones.        Past Medical History:  Diagnosis Date  . Hypertension     There are no problems to display for this patient.   Past Surgical History:  Procedure Laterality Date  . APPENDECTOMY    . TONSILLECTOMY       OB History   No obstetric history on file.     No family history on file.  Social History   Tobacco Use  . Smoking status: Never Smoker  . Smokeless tobacco: Never Used  Substance Use Topics  . Alcohol use: No  . Drug use: No    Home Medications Prior to Admission medications   Medication Sig Start Date End Date Taking? Authorizing Provider  cephALEXin (KEFLEX) 500 MG capsule Take 1 capsule (500 mg total) by mouth 4 (four) times daily. 05/12/17   Zadie Rhine, MD  HYDROcodone-acetaminophen (NORCO/VICODIN) 5-325 MG tablet Take 1 tablet by mouth every 6 (six) hours as needed for moderate pain. 06/24/19   Vanetta Mulders, MD  ibuprofen (ADVIL,MOTRIN) 600 MG tablet Take 1 tablet (600 mg total) by mouth every 6 (six) hours as needed. 11/05/17   Derwood Kaplan, MD  lisinopril-hydrochlorothiazide (PRINZIDE,ZESTORETIC) 10-12.5 MG per tablet Take 1 tablet by mouth daily.    [provider]  ondansetron (ZOFRAN ODT) 4 MG disintegrating tablet Take 1 tablet (4 mg total) by mouth every 8 (eight) hours as needed. 06/24/19   Vanetta Mulders, MD    Allergies    Patient has no known  allergies.  Review of Systems   Review of Systems  Constitutional: Negative for chills and fever.  HENT: Negative for congestion, rhinorrhea and sore throat.   Eyes: Negative for visual disturbance.  Respiratory: Negative for cough and shortness of breath.   Cardiovascular: Negative for chest pain and leg swelling.  Gastrointestinal: Positive for abdominal pain. Negative for diarrhea, nausea and vomiting.  Genitourinary: Positive for dysuria and flank pain.  Musculoskeletal: Negative for back pain and neck pain.  Skin: Negative for rash.  Neurological: Negative for dizziness, light-headedness and headaches.  Hematological: Does not bruise/bleed easily.  Psychiatric/Behavioral: Negative for confusion.    Physical Exam Updated Vital Signs BP (!) 160/68 (BP Location: Left Arm)   Pulse 76   Temp 98.2 F (36.8 C) (Oral)   Resp 20   Ht 1.676 m (5\' 6" )   Wt 95.3 kg   LMP 06/24/2019   SpO2 99%   BMI 33.89 kg/m   Physical Exam Vitals and nursing note reviewed.  Constitutional:      General: She is not in acute distress.    Appearance: Normal appearance. She is well-developed.  HENT:     Head: Normocephalic and atraumatic.  Eyes:     Extraocular Movements: Extraocular movements intact.     Conjunctiva/sclera: Conjunctivae normal.     Pupils: Pupils are equal, round, and reactive  to light.  Cardiovascular:     Rate and Rhythm: Normal rate and regular rhythm.     Heart sounds: No murmur heard.   Pulmonary:     Effort: Pulmonary effort is normal. No respiratory distress.     Breath sounds: Normal breath sounds.  Abdominal:     Palpations: Abdomen is soft.     Tenderness: There is no abdominal tenderness.  Musculoskeletal:        General: Normal range of motion.     Cervical back: Normal range of motion and neck supple.  Skin:    General: Skin is warm and dry.     Capillary Refill: Capillary refill takes less than 2 seconds.  Neurological:     General: No focal deficit  present.     Mental Status: She is alert and oriented to person, place, and time.     Cranial Nerves: No cranial nerve deficit.     Sensory: No sensory deficit.     Motor: No weakness.     ED Results / Procedures / Treatments   Labs (all labs ordered are listed, but only abnormal results are displayed) Labs Reviewed  URINALYSIS, ROUTINE W REFLEX MICROSCOPIC - Abnormal; Notable for the following components:      Result Value   APPearance CLOUDY (*)    Hgb urine dipstick LARGE (*)    Leukocytes,Ua TRACE (*)    All other components within normal limits  CBC - Abnormal; Notable for the following components:   WBC 15.3 (*)    Hemoglobin 11.9 (*)    MCV 79.6 (*)    MCH 25.3 (*)    All other components within normal limits  BASIC METABOLIC PANEL - Abnormal; Notable for the following components:   Potassium 3.3 (*)    Chloride 97 (*)    Glucose, Bld 140 (*)    BUN 30 (*)    Creatinine, Ser 1.07 (*)    GFR calc non Af Amer 58 (*)    All other components within normal limits  URINALYSIS, MICROSCOPIC (REFLEX) - Abnormal; Notable for the following components:   Bacteria, UA RARE (*)    All other components within normal limits    EKG None  Radiology CT Renal Stone Study  Result Date: 06/24/2019 CLINICAL DATA:  Acute lower abdominal pain. EXAM: CT ABDOMEN AND PELVIS WITHOUT CONTRAST TECHNIQUE: Multidetector CT imaging of the abdomen and pelvis was performed following the standard protocol without IV contrast. COMPARISON:  None. FINDINGS: Lower chest: No acute abnormality. Hepatobiliary: No gallstones or biliary dilatation is noted. Mildly nodular hepatic contours are noted inferiorly suggesting possible hepatic cirrhosis. No definite focal abnormality is seen on these unenhanced images. Pancreas: Unremarkable. No pancreatic ductal dilatation or surrounding inflammatory changes. Spleen: Normal in size without focal abnormality. Adrenals/Urinary Tract: Adrenal glands appear normal. Right  kidney and ureter are unremarkable. Minimal left hydroureteronephrosis is noted secondary to 5 mm calculus at the left ureterovesical junction. Urinary bladder is decompressed. Stomach/Bowel: Stomach appears normal. Status post appendectomy. There is no definite evidence of bowel obstruction or inflammation. Sigmoid diverticulosis is noted without inflammation. Vascular/Lymphatic: No significant vascular findings are present. No enlarged abdominal or pelvic lymph nodes. Reproductive: Uterus and bilateral adnexa are unremarkable. Other: Small fat containing right inguinal hernia is noted. No ascites is noted. Musculoskeletal: No acute or significant osseous findings. IMPRESSION: 1. Minimal left hydroureteronephrosis is noted secondary to 5 mm calculus at the left ureterovesical junction. 2. Mildly nodular hepatic contours are noted inferiorly suggesting possible  hepatic cirrhosis. 3. Sigmoid diverticulosis without inflammation. 4. Small fat containing right inguinal hernia. Electronically Signed   By: Lupita Raider M.D.   On: 06/24/2019 20:13    Procedures Procedures (including critical care time)  Medications Ordered in ED Medications  ondansetron (ZOFRAN) injection 4 mg (4 mg Intravenous Given 06/24/19 1956)  HYDROmorphone (DILAUDID) injection 1 mg (1 mg Intravenous Given 06/24/19 1957)  ketorolac (TORADOL) 30 MG/ML injection 30 mg (30 mg Intravenous Given 06/24/19 2118)    ED Course  I have reviewed the triage vital signs and the nursing notes.  Pertinent labs & imaging results that were available during my care of the patient were reviewed by me and considered in my medical decision making (see chart for details).    MDM Rules/Calculators/A&P                          Work-up shows evidence of 5 mm distal left ureter stone.  Urinalysis without evidence of infection.  Little bit of hydronephrosis.  There is a leukocytosis on the white blood cell count.  BUN and creatinine up a little bit at 30  and 1.07.  Renal function without significant abnormalities.  First time kidney stone.  Patient treated here with hydromorphone and Zofran improved.  We will give her a dose of Toradol as well.  Will have her follow-up with urology Zofran and hydrocodone as needed for pain at home.  Also stated that patient could get a lot of relief from medicines like Motrin and Aleve.   Final Clinical Impression(s) / ED Diagnoses Final diagnoses:  Left ureteral stone    Rx / DC Orders ED Discharge Orders         Ordered    HYDROcodone-acetaminophen (NORCO/VICODIN) 5-325 MG tablet  Every 6 hours PRN     Discontinue  Reprint     06/24/19 2144    ondansetron (ZOFRAN ODT) 4 MG disintegrating tablet  Every 8 hours PRN     Discontinue  Reprint     06/24/19 2144           Vanetta Mulders, MD 06/24/19 2147

## 2019-06-24 NOTE — Discharge Instructions (Signed)
Take the hydrocodone as needed for pain.  Also take the Zofran as needed for nausea.  Also will find that medications like Motrin or Aleve are very helpful for kidney stones.  Can appointment to follow-up with urology.  Return if not improving over the next couple days or for any new or worse symptoms.  Work note provided.

## 2019-06-24 NOTE — ED Notes (Signed)
Patient transported to CT 

## 2020-08-16 ENCOUNTER — Emergency Department (HOSPITAL_BASED_OUTPATIENT_CLINIC_OR_DEPARTMENT_OTHER)
Admission: EM | Admit: 2020-08-16 | Discharge: 2020-08-16 | Disposition: A | Discharge status: Home / Self Care | Payer: BC Managed Care – PPO | Attending: Emergency Medicine | Admitting: Emergency Medicine

## 2020-08-16 ENCOUNTER — Encounter (HOSPITAL_BASED_OUTPATIENT_CLINIC_OR_DEPARTMENT_OTHER): Payer: Self-pay

## 2020-08-16 ENCOUNTER — Emergency Department (HOSPITAL_BASED_OUTPATIENT_CLINIC_OR_DEPARTMENT_OTHER): Payer: BC Managed Care – PPO

## 2020-08-16 ENCOUNTER — Other Ambulatory Visit: Payer: Self-pay

## 2020-08-16 DIAGNOSIS — N23 Unspecified renal colic: Secondary | ICD-10-CM

## 2020-08-16 DIAGNOSIS — N132 Hydronephrosis with renal and ureteral calculous obstruction: Secondary | ICD-10-CM | POA: Diagnosis not present

## 2020-08-16 DIAGNOSIS — R109 Unspecified abdominal pain: Secondary | ICD-10-CM | POA: Diagnosis present

## 2020-08-16 DIAGNOSIS — Z79899 Other long term (current) drug therapy: Secondary | ICD-10-CM | POA: Insufficient documentation

## 2020-08-16 DIAGNOSIS — I10 Essential (primary) hypertension: Secondary | ICD-10-CM | POA: Diagnosis not present

## 2020-08-16 LAB — CBC WITH DIFFERENTIAL/PLATELET
Abs Immature Granulocytes: 0.05 10*3/uL (ref 0.00–0.07)
Basophils Absolute: 0.1 10*3/uL (ref 0.0–0.1)
Basophils Relative: 1 %
Eosinophils Absolute: 0.1 10*3/uL (ref 0.0–0.5)
Eosinophils Relative: 1 %
HCT: 37 % (ref 36.0–46.0)
Hemoglobin: 12.2 g/dL (ref 12.0–15.0)
Immature Granulocytes: 0 %
Lymphocytes Relative: 10 %
Lymphs Abs: 1.2 10*3/uL (ref 0.7–4.0)
MCH: 26.1 pg (ref 26.0–34.0)
MCHC: 33 g/dL (ref 30.0–36.0)
MCV: 79.2 fL — ABNORMAL LOW (ref 80.0–100.0)
Monocytes Absolute: 0.9 10*3/uL (ref 0.1–1.0)
Monocytes Relative: 7 %
Neutro Abs: 10.4 10*3/uL — ABNORMAL HIGH (ref 1.7–7.7)
Neutrophils Relative %: 81 %
Platelets: 369 10*3/uL (ref 150–400)
RBC: 4.67 MIL/uL (ref 3.87–5.11)
RDW: 14.5 % (ref 11.5–15.5)
WBC: 12.8 10*3/uL — ABNORMAL HIGH (ref 4.0–10.5)
nRBC: 0 % (ref 0.0–0.2)

## 2020-08-16 LAB — URINALYSIS, MICROSCOPIC (REFLEX)

## 2020-08-16 LAB — COMPREHENSIVE METABOLIC PANEL
ALT: 19 U/L (ref 0–44)
AST: 20 U/L (ref 15–41)
Albumin: 5.1 g/dL — ABNORMAL HIGH (ref 3.5–5.0)
Alkaline Phosphatase: 66 U/L (ref 38–126)
Anion gap: 11 (ref 5–15)
BUN: 35 mg/dL — ABNORMAL HIGH (ref 6–20)
CO2: 27 mmol/L (ref 22–32)
Calcium: 9.5 mg/dL (ref 8.9–10.3)
Chloride: 100 mmol/L (ref 98–111)
Creatinine, Ser: 1.06 mg/dL — ABNORMAL HIGH (ref 0.44–1.00)
GFR, Estimated: >60 mL/min (ref >60)
Glucose, Bld: 106 mg/dL — ABNORMAL HIGH (ref 70–99)
Potassium: 4 mmol/L (ref 3.5–5.1)
Sodium: 138 mmol/L (ref 135–145)
Total Bilirubin: 0.4 mg/dL (ref 0.3–1.2)
Total Protein: 8.2 g/dL — ABNORMAL HIGH (ref 6.5–8.1)

## 2020-08-16 LAB — URINALYSIS, ROUTINE W REFLEX MICROSCOPIC
Bilirubin Urine: NEGATIVE
Glucose, UA: NEGATIVE mg/dL
Ketones, ur: NEGATIVE mg/dL
Nitrite: NEGATIVE
Protein, ur: NEGATIVE mg/dL
Specific Gravity, Urine: >1.03 — ABNORMAL HIGH (ref 1.005–1.030)
pH: 5.5 (ref 5.0–8.0)

## 2020-08-16 LAB — LIPASE, BLOOD: Lipase: 27 U/L (ref 11–51)

## 2020-08-16 MED ORDER — HYDROCODONE-ACETAMINOPHEN 5-325 MG PO TABS: 1.0000 | ORAL_TABLET | Freq: Four times a day (QID) | ORAL | 0 refills | Status: AC | PRN

## 2020-08-16 MED ORDER — ONDANSETRON HCL 4 MG/2ML IJ SOLN
4.0000 mg | Freq: Once | INTRAMUSCULAR | Status: AC
  Administered 2020-08-16: 4 mg via INTRAVENOUS
  Filled 2020-08-16: qty 2

## 2020-08-16 MED ORDER — SODIUM CHLORIDE 0.9 % IV BOLUS
1000.0000 mL | Freq: Once | INTRAVENOUS | Status: AC
  Administered 2020-08-16: 1000 mL via INTRAVENOUS

## 2020-08-16 MED ORDER — TAMSULOSIN HCL 0.4 MG PO CAPS: 0.4000 mg | ORAL_CAPSULE | Freq: Every day | ORAL | 0 refills | Status: DC

## 2020-08-16 MED ORDER — KETOROLAC TROMETHAMINE 30 MG/ML IJ SOLN
30.0000 mg | Freq: Once | INTRAMUSCULAR | Status: AC
  Administered 2020-08-16: 30 mg via INTRAVENOUS
  Filled 2020-08-16: qty 1

## 2020-08-16 MED ORDER — SODIUM CHLORIDE 0.9 % IV SOLN
1.0000 g | Freq: Once | INTRAVENOUS | Status: DC
  Filled 2020-08-16: qty 10

## 2020-08-16 MED ORDER — MORPHINE SULFATE (PF) 4 MG/ML IV SOLN
4.0000 mg | Freq: Once | INTRAVENOUS | Status: AC
  Administered 2020-08-16: 4 mg via INTRAVENOUS
  Filled 2020-08-16: qty 1

## 2020-08-16 MED ORDER — ONDANSETRON 4 MG PO TBDP
ORAL_TABLET | ORAL | 0 refills | Status: DC
Start: 2020-08-16 — End: 2021-07-29

## 2020-08-16 NOTE — ED Notes (Signed)
Patient transported to CT 

## 2020-08-16 NOTE — Discharge Instructions (Addendum)
Take motrin for pain   Your UA didn't show obvious UTI. Stop taking augmentin   Take zofran for nausea   Take Vicodin for severe pain  See urology for follow up  Return to ER if you have worse abdominal pain, vomiting, flank pain, fever

## 2020-08-16 NOTE — ED Provider Notes (Signed)
[facility] EMERGENCY DEPARTMENT Provider Note   CSN: [id number] Arrival date & time: 08/16/20  1301     History Chief Complaint  Patient presents with   Abdominal Pain    Tina Lowe is a 55 y.o. female hx of HTN, here with flank pain. Patient had dysuria yesterday.  She went to her primary care doctor's office and had a urinalysis that is positive for UTI.  Patient was prescribed Augmentin.  She did take a dose of augmentin and felt nauseated. She has sudden onset of left flank pain since yesterday.  She states that it felt like her previous kidney stone.  She had kidney stone 2 months ago but passed without any interventions.   The history is provided by the patient.      Past Medical History:  Diagnosis Date   Hypertension     There are no problems to display for this patient.   Past Surgical History:  Procedure Laterality Date   APPENDECTOMY     TONSILLECTOMY       OB History   No obstetric history on file.     No family history on file.  Social History   Tobacco Use   Smoking status: Never   Smokeless tobacco: Never  Substance Use Topics   Alcohol use: No   Drug use: No    Home Medications Prior to Admission medications   Medication Sig Start Date End Date Taking? Authorizing Provider  cephALEXin (KEFLEX) 500 MG capsule Take 1 capsule (500 mg total) by mouth 4 (four) times daily. 05/12/17   Zadie Rhine, MD  HYDROcodone-acetaminophen (NORCO/VICODIN) 5-325 MG tablet Take 1 tablet by mouth every 6 (six) hours as needed for moderate pain. 06/24/19   Vanetta Mulders, MD  ibuprofen (ADVIL,MOTRIN) 600 MG tablet Take 1 tablet (600 mg total) by mouth every 6 (six) hours as needed. 11/05/17   Derwood Kaplan, MD  lisinopril-hydrochlorothiazide (PRINZIDE,ZESTORETIC) 10-12.5 MG per tablet Take 1 tablet by mouth daily.    [provider]  ondansetron (ZOFRAN ODT) 4 MG disintegrating tablet Take 1 tablet (4 mg total) by mouth every 8 (eight)  hours as needed. 06/24/19   Vanetta Mulders, MD    Allergies    Patient has no known allergies.  Review of Systems   Review of Systems  Gastrointestinal:  Positive for abdominal pain.  All other systems reviewed and are negative.  Physical Exam Updated Vital Signs BP 127/72 (BP Location: Left Arm)   Pulse 73   Temp 97.9 F (36.6 C) (Oral)   Resp 18   Ht 5\' 8"  (1.727 m)   Wt 95.7 kg   LMP 06/24/2019   SpO2 100%   BMI 32.08 kg/m   Physical Exam Vitals and nursing note reviewed.  Constitutional:      Comments: Uncomfortable, writhing around   HENT:     Head: Normocephalic.  Eyes:     Extraocular Movements: Extraocular movements intact.     Pupils: Pupils are equal, round, and reactive to light.  Cardiovascular:     Rate and Rhythm: Normal rate and regular rhythm.     Heart sounds: Normal heart sounds.  Pulmonary:     Effort: Pulmonary effort is normal.     Breath sounds: Normal breath sounds.  Abdominal:     General: Abdomen is flat.     Comments: + L CVAT   Skin:    General: Skin is warm.     Capillary Refill: Capillary refill takes less than 2 seconds.  Neurological:     General: No focal deficit present.     Mental Status: She is oriented to person, place, and time.  Psychiatric:        Mood and Affect: Mood normal.        Behavior: Behavior normal.    ED Results / Procedures / Treatments   Labs (all labs ordered are listed, but only abnormal results are displayed) Labs Reviewed  CBC WITH DIFFERENTIAL/PLATELET - Abnormal; Notable for the following components:      Result Value   WBC 12.8 (*)    MCV 79.2 (*)    Neutro Abs 10.4 (*)    All other components within normal limits  COMPREHENSIVE METABOLIC PANEL - Abnormal; Notable for the following components:   Glucose, Bld 106 (*)    BUN 35 (*)    Creatinine, Ser 1.06 (*)    Total Protein 8.2 (*)    Albumin 5.1 (*)    All other components within normal limits  URINALYSIS, ROUTINE W REFLEX MICROSCOPIC  - Abnormal; Notable for the following components:   Specific Gravity, Urine >1.030 (*)    Hgb urine dipstick LARGE (*)    Leukocytes,Ua TRACE (*)    All other components within normal limits  URINALYSIS, MICROSCOPIC (REFLEX) - Abnormal; Notable for the following components:   Bacteria, UA FEW (*)    All other components within normal limits  URINE CULTURE  LIPASE, BLOOD    EKG None  Radiology CT Renal Stone Study  Result Date: 08/16/2020 CLINICAL DATA:  Flank pain.  Kidney stone suspected. EXAM: CT ABDOMEN AND PELVIS WITHOUT CONTRAST TECHNIQUE: Multidetector CT imaging of the abdomen and pelvis was performed following the standard protocol without IV contrast. COMPARISON:  Most recent CT 06/24/2019 FINDINGS: Lower chest: Borderline cardiomegaly. No acute airspace disease or pleural effusion. Hepatobiliary: No focal liver abnormality is seen. No gallstones, gallbladder wall thickening, or biliary dilatation. Pancreas: No ductal dilatation or inflammation. Spleen: Normal in size without focal abnormality. Adrenals/Urinary Tract: Normal adrenal glands. Elongated stone versus 2 adjacent stones at the left ureterovesicular junction measure 4 x 6 mm. There is moderate proximal hydroureter, with mild hydronephrosis. Mild left perinephric edema. Additional 4 mm nonobstructing stone in the lower left kidney. No right hydronephrosis. There may be a punctate nonobstructing stone in the mid right kidney. No evidence of focal renal lesion. Minimal cortical scarring in the lower right kidney. Right ureter is decompressed. The urinary bladder is nondistended. Stomach/Bowel: The stomach is decompressed. There is no small bowel obstruction or inflammation. No bowel wall thickening. The appendix is not definitively visualized, no evidence of appendicitis. Diverticulosis of the descending colon distally. No diverticulitis. Vascular/Lymphatic: Mild to moderate aortic atherosclerosis. No aortic aneurysm. No portal  venous or mesenteric gas. There is no abdominopelvic adenopathy. Reproductive: Uterus and bilateral adnexa are unremarkable. Other: No free air, free fluid, or intra-abdominal fluid collection. Musculoskeletal: There is lumbar facet hypertrophy. There are no acute or suspicious osseous abnormalities. IMPRESSION: 1. Elongated stone versus 2 adjacent stones at the left ureterovesicular junction measuring 4 x 6 mm with moderate proximal hydroureter and mild hydronephrosis. 2. Additional nonobstructing stones in both kidneys. 3. Colonic diverticulosis without diverticulitis. Aortic Atherosclerosis (ICD10-I70.0). Electronically Signed   By: Narda Rutherford M.D.   On: 08/16/2020 16:01    Procedures Procedures   Medications Ordered in ED Medications  cefTRIAXone (ROCEPHIN) 1 g in sodium chloride 0.9 % 100 mL IVPB (1 g Intravenous Not Given 08/16/20 1533)  sodium chloride 0.9 %  bolus 1,000 mL (0 mLs Intravenous Stopped 08/16/20 1704)  ondansetron (ZOFRAN) injection 4 mg (4 mg Intravenous Given 08/16/20 1531)  morphine 4 MG/ML injection 4 mg (4 mg Intravenous Given 08/16/20 1532)  ketorolac (TORADOL) 30 MG/ML injection 30 mg (30 mg Intravenous Given 08/16/20 1703)    ED Course  I have reviewed the triage vital signs and the nursing notes.  Pertinent labs & imaging results that were available during my care of the patient were reviewed by me and considered in my medical decision making (see chart for details).    MDM Rules/Calculators/A&P                           Deari Sessler is a 55 y.o. female here with L flank pain. Had UTI yesterday. Concerned for pyelo vs infected kidney stone. Will get CT renal stone, UA, CBC, CMP. Will give pain meds and IVF   5:06 PM However cell count is 12.  Urinalysis showed large blood with no obvious UTI.  I also reviewed her urinalysis from yesterday.  It is mainly red blood cells and few bacteria and no frank signs of UTI.  CT showed 2 stones around 4 x 6 mm with hydro.   Patient's pain is under control.  I do not think she needs antibiotics right now.  She can be discharged home with some pain medicine and Flomax and urology follow-up  Final Clinical Impression(s) / ED Diagnoses Final diagnoses:  None    Rx / DC Orders ED Discharge Orders     None        Charlynne Pander, MD 08/16/20 1709

## 2020-08-16 NOTE — ED Triage Notes (Signed)
Pt states that she went to the doctor for UTI, reports she took Rx medication and now feels nauseated. States that the pain is similar to when she had a kidney stone.

## 2020-08-18 LAB — URINE CULTURE: Culture: NO GROWTH

## 2021-07-29 ENCOUNTER — Encounter (HOSPITAL_BASED_OUTPATIENT_CLINIC_OR_DEPARTMENT_OTHER): Payer: Self-pay | Admitting: Emergency Medicine

## 2021-07-29 ENCOUNTER — Emergency Department (HOSPITAL_BASED_OUTPATIENT_CLINIC_OR_DEPARTMENT_OTHER)
Admission: EM | Admit: 2021-07-29 | Discharge: 2021-07-29 | Disposition: A | Discharge status: Home / Self Care | Payer: BC Managed Care – PPO | Attending: Emergency Medicine | Admitting: Emergency Medicine

## 2021-07-29 ENCOUNTER — Other Ambulatory Visit: Payer: Self-pay

## 2021-07-29 ENCOUNTER — Emergency Department (HOSPITAL_BASED_OUTPATIENT_CLINIC_OR_DEPARTMENT_OTHER): Payer: BC Managed Care – PPO

## 2021-07-29 DIAGNOSIS — N200 Calculus of kidney: Secondary | ICD-10-CM | POA: Diagnosis not present

## 2021-07-29 DIAGNOSIS — Z79899 Other long term (current) drug therapy: Secondary | ICD-10-CM | POA: Insufficient documentation

## 2021-07-29 DIAGNOSIS — R300 Dysuria: Secondary | ICD-10-CM | POA: Diagnosis present

## 2021-07-29 LAB — URINALYSIS, ROUTINE W REFLEX MICROSCOPIC

## 2021-07-29 LAB — URINALYSIS, MICROSCOPIC (REFLEX): RBC / HPF: >50 RBC/hpf (ref 0–5)

## 2021-07-29 MED ORDER — MORPHINE SULFATE 15 MG PO TABS: 7.5000 mg | ORAL_TABLET | ORAL | 0 refills | Status: AC | PRN

## 2021-07-29 MED ORDER — MORPHINE SULFATE (PF) 4 MG/ML IV SOLN
4.0000 mg | Freq: Once | INTRAVENOUS | Status: AC
  Administered 2021-07-29: 4 mg via INTRAVENOUS
  Filled 2021-07-29: qty 1

## 2021-07-29 MED ORDER — ONDANSETRON HCL 4 MG/2ML IJ SOLN
4.0000 mg | Freq: Once | INTRAMUSCULAR | Status: AC
  Administered 2021-07-29: 4 mg via INTRAVENOUS
  Filled 2021-07-29: qty 2

## 2021-07-29 MED ORDER — ONDANSETRON 4 MG PO TBDP: ORAL_TABLET | ORAL | 0 refills | Status: AC

## 2021-07-29 MED ORDER — TAMSULOSIN HCL 0.4 MG PO CAPS: 0.4000 mg | ORAL_CAPSULE | Freq: Every day | ORAL | 0 refills | Status: AC

## 2021-07-29 MED ORDER — SODIUM CHLORIDE 0.9 % IV BOLUS
1000.0000 mL | Freq: Once | INTRAVENOUS | Status: AC
  Administered 2021-07-29: 1000 mL via INTRAVENOUS

## 2021-07-29 MED ORDER — KETOROLAC TROMETHAMINE 15 MG/ML IJ SOLN: 15.0000 mg | Freq: Once | INTRAMUSCULAR | Status: DC

## 2021-07-29 NOTE — ED Notes (Signed)
Patient states she had a kidney stone last year and that the pain feel the same as then. States that she has dysuria and that it has a odor . Pain x1 week States that her urine is amber in colorPain radiates from the back to her abdominal area.

## 2021-07-29 NOTE — ED Provider Notes (Signed)
[facility] EMERGENCY DEPARTMENT Provider Note   CSN: [id number] Arrival date & time: 07/29/21  1718     History  Chief Complaint  Patient presents with   Dysuria    Tina Lowe is a 56 y.o. female.  56 yo F with a chief complaints of left flank pain.  This been going on for about a week.  Seems to come and go.  Said some nausea with it which is different from her prior kidney stones.  She is never required intervention for kidney stones in the past.  Denies fevers or chills.  Does have some pain when she urinates.   Dysuria      Home Medications Prior to Admission medications   Medication Sig Start Date End Date Taking? Authorizing Provider  morphine (MSIR) 15 MG tablet Take 0.5 tablets (7.5 mg total) by mouth every 4 (four) hours as needed for severe pain. 07/29/21  Yes Melene Plan, DO  ondansetron (ZOFRAN-ODT) 4 MG disintegrating tablet 4mg  ODT q4 hours prn nausea/vomit 07/29/21  Yes 07/31/21, DO  tamsulosin (FLOMAX) 0.4 MG CAPS capsule Take 1 capsule (0.4 mg total) by mouth daily after supper. 07/29/21  Yes 07/31/21, DO  cephALEXin (KEFLEX) 500 MG capsule Take 1 capsule (500 mg total) by mouth 4 (four) times daily. 05/12/17   07/12/17, MD  HYDROcodone-acetaminophen (NORCO/VICODIN) 5-325 MG tablet Take 1 tablet by mouth every 6 (six) hours as needed. 08/16/20   08/18/20, MD  ibuprofen (ADVIL,MOTRIN) 600 MG tablet Take 1 tablet (600 mg total) by mouth every 6 (six) hours as needed. 11/05/17   13/3/19, MD  lisinopril-hydrochlorothiazide (PRINZIDE,ZESTORETIC) 10-12.5 MG per tablet Take 1 tablet by mouth daily.    [provider]      Allergies    Patient has no known allergies.    Review of Systems   Review of Systems  Genitourinary:  Positive for dysuria.    Physical Exam Updated Vital Signs BP (!) 132/55 (BP Location: Left Arm)   Pulse 70   Temp 98.6 F (37 C) (Oral)   Resp 16   Ht 5\' 8"  (1.727 m)   Wt 93.4 kg   LMP  06/24/2019   SpO2 96%   BMI 31.32 kg/m  Physical Exam Vitals and nursing note reviewed.  Constitutional:      General: She is not in acute distress.    Appearance: She is well-developed. She is not diaphoretic.  HENT:     Head: Normocephalic and atraumatic.  Eyes:     Pupils: Pupils are equal, round, and reactive to light.  Cardiovascular:     Rate and Rhythm: Normal rate and regular rhythm.     Heart sounds: No murmur heard.    No friction rub. No gallop.  Pulmonary:     Effort: Pulmonary effort is normal.     Breath sounds: No wheezing or rales.  Abdominal:     General: There is no distension.     Palpations: Abdomen is soft.     Tenderness: There is abdominal tenderness.     Comments: Pain to the left lower quadrant without guarding or rebound.  Musculoskeletal:        General: No tenderness.     Cervical back: Normal range of motion and neck supple.  Skin:    General: Skin is warm and dry.  Neurological:     Mental Status: She is alert and oriented to person, place, and time.  Psychiatric:  Behavior: Behavior normal.     ED Results / Procedures / Treatments   Labs (all labs ordered are listed, but only abnormal results are displayed) Labs Reviewed  URINALYSIS, ROUTINE W REFLEX MICROSCOPIC - Abnormal; Notable for the following components:      Result Value   Color, Urine BROWN (*)    APPearance TURBID (*)    Glucose, UA   (*)    Value: TEST NOT REPORTED DUE TO COLOR INTERFERENCE OF URINE PIGMENT   Hgb urine dipstick   (*)    Value: TEST NOT REPORTED DUE TO COLOR INTERFERENCE OF URINE PIGMENT   Bilirubin Urine   (*)    Value: TEST NOT REPORTED DUE TO COLOR INTERFERENCE OF URINE PIGMENT   Ketones, ur   (*)    Value: TEST NOT REPORTED DUE TO COLOR INTERFERENCE OF URINE PIGMENT   Protein, ur   (*)    Value: TEST NOT REPORTED DUE TO COLOR INTERFERENCE OF URINE PIGMENT   Nitrite   (*)    Value: TEST NOT REPORTED DUE TO COLOR INTERFERENCE OF URINE PIGMENT    Leukocytes,Ua   (*)    Value: TEST NOT REPORTED DUE TO COLOR INTERFERENCE OF URINE PIGMENT   All other components within normal limits  URINALYSIS, MICROSCOPIC (REFLEX) - Abnormal; Notable for the following components:   Bacteria, UA FEW (*)    All other components within normal limits    EKG None  Radiology CT Renal Stone Study  Result Date: 07/29/2021 CLINICAL DATA:  Quality urine with flank pain EXAM: CT ABDOMEN AND PELVIS WITHOUT CONTRAST TECHNIQUE: Multidetector CT imaging of the abdomen and pelvis was performed following the standard protocol without IV contrast. RADIATION DOSE REDUCTION: This exam was performed according to the departmental dose-optimization program which includes automated exposure control, adjustment of the mA and/or kV according to patient size and/or use of iterative reconstruction technique. COMPARISON:  08/16/2020 FINDINGS: Lower chest: Lung bases demonstrate no acute airspace disease. Borderline to mild cardiomegaly. Trace pericardial effusion Hepatobiliary: No focal liver abnormality is seen. No gallstones, gallbladder wall thickening, or biliary dilatation. Pancreas: Unremarkable. No pancreatic ductal dilatation or surrounding inflammatory changes. Spleen: Normal in size without focal abnormality. Adrenals/Urinary Tract: Adrenal glands are normal. Mild left hydronephrosis and hydroureter, secondary to a 4 mm stone in the distal left ureter about 4 cm proximal to the left UVJ. There is an additional punctate stone within the upper left ureter just past the UPJ which is not the source of obstruction. The bladder is unremarkable Stomach/Bowel: Stomach is within normal limits. Appendix not well seen but no right lower quadrant inflammation. No evidence of bowel wall thickening, distention, or inflammatory changes. Diverticular disease of left colon without acute inflammation Vascular/Lymphatic: Mild aortic atherosclerosis. No aneurysm. No suspicious lymph nodes.  Reproductive: Uterus and bilateral adnexa are unremarkable. Other: Negative for pelvic effusion or free air. Musculoskeletal: No acute or significant osseous findings. IMPRESSION: 1. Mild left hydronephrosis and hydroureter, secondary to a 4 mm distal ureteral stone about 4 cm proximal to the left UVJ. Additional punctate nonobstructing stone within the proximal left ureter just past the UPJ. 2. Diverticular disease of the left colon without acute inflammatory process. Electronically Signed   By: Jasmine Pang M.D.   On: 07/29/2021 19:32    Procedures Procedures    Medications Ordered in ED Medications  ketorolac (TORADOL) 15 MG/ML injection 15 mg (15 mg Intravenous Not Given 07/29/21 1915)  sodium chloride 0.9 % bolus 1,000 mL (0 mLs Intravenous  Stopped 07/29/21 1947)  morphine (PF) 4 MG/ML injection 4 mg (4 mg Intravenous Given 07/29/21 1904)  ondansetron (ZOFRAN) injection 4 mg (4 mg Intravenous Given 07/29/21 1853)    ED Course/ Medical Decision Making/ A&P                           Medical Decision Making Amount and/or Complexity of Data Reviewed Labs: ordered. Radiology: ordered.  Risk Prescription drug management.   56 yo F with a chief complaints of left flank pain.  Feels similar to when she had a kidney stone in the past.  Never had nausea with it before.  Has focal left lower quadrant tenderness.  Will obtain a CT stone study.  UA with gross hematuria.  Treat pain.  Reassess.  UA too bloody to be able to interpret on dipstick.  Microscopic UA without obvious infection is independently interpreted by me.  CT stone study with a left-sided 4 mm stone with some hydronephrosis just proximal to the UVJ.  I discussed results with the patient.  Feeling much better.  We will have her follow-up with outpatient urology.  8:20 PM:  I have discussed the diagnosis/risks/treatment options with the patient.  Evaluation and diagnostic testing in the emergency department does not suggest an  emergent condition requiring admission or immediate intervention beyond what has been performed at this time.  They will follow up with  Urology. We also discussed returning to the ED immediately if new or worsening sx occur. We discussed the sx which are most concerning (e.g., sudden worsening pain, fever, inability to tolerate by mouth) that necessitate immediate return. Medications administered to the patient during their visit and any new prescriptions provided to the patient are listed below.  Medications given during this visit Medications  ketorolac (TORADOL) 15 MG/ML injection 15 mg (15 mg Intravenous Not Given 07/29/21 1915)  sodium chloride 0.9 % bolus 1,000 mL (0 mLs Intravenous Stopped 07/29/21 1947)  morphine (PF) 4 MG/ML injection 4 mg (4 mg Intravenous Given 07/29/21 1904)  ondansetron (ZOFRAN) injection 4 mg (4 mg Intravenous Given 07/29/21 1853)     The patient appears reasonably screen and/or stabilized for discharge and I doubt any other medical condition or other [facility] requiring further screening, evaluation, or treatment in the ED at this time prior to discharge.          Final Clinical Impression(s) / ED Diagnoses Final diagnoses:  Nephrolithiasis    Rx / DC Orders ED Discharge Orders          Ordered    morphine (MSIR) 15 MG tablet  Every 4 hours PRN        07/29/21 1940    ondansetron (ZOFRAN-ODT) 4 MG disintegrating tablet        07/29/21 1940    tamsulosin (FLOMAX) 0.4 MG CAPS capsule  Daily after supper        07/29/21 [place], Rylen Hou, DO 07/29/21 2020

## 2021-07-29 NOTE — Discharge Instructions (Signed)
Follow-up with your urologist in the office.  Please return for worsening pain fever or inability to drink.  Take 4 over the counter ibuprofen tablets 3 times a day or 2 over-the-counter naproxen tablets twice a day for pain. Also take tylenol 1000mg (2 extra strength) four times a day.   Then take the pain medicine if you feel like you need it. Narcotics do not help with the pain, they only make you care about it less.  You can become addicted to this, people may break into your house to steal it.  It will constipate you.  If you drive under the influence of this medicine you can get a DUI.

## 2021-07-29 NOTE — ED Triage Notes (Addendum)
Pt POV c/o hematuria, cloudy urine. Pain to left side, radiating to back, nausea x3 days. Reports burning with urination starting today.   dx with kidney stones, reports pain is similar.

## 2021-12-13 IMAGING — CT CT RENAL STONE PROTOCOL
2 of 4 series · 16 of 46 positions shown, 18 images · non-contrast
Comparison: None.

CLINICAL DATA: Acute lower abdominal pain.

EXAM:
CT ABDOMEN AND PELVIS WITHOUT CONTRAST
TECHNIQUE: Multidetector CT imaging of the abdomen and pelvis was performed
following the standard protocol without IV contrast.

[Series 2: axial st · axial · 0.69mm/px · z∈[-597,-192]mm · 13 of 89 slices shown, 15 images]
[im 4/89  soft-tissue]
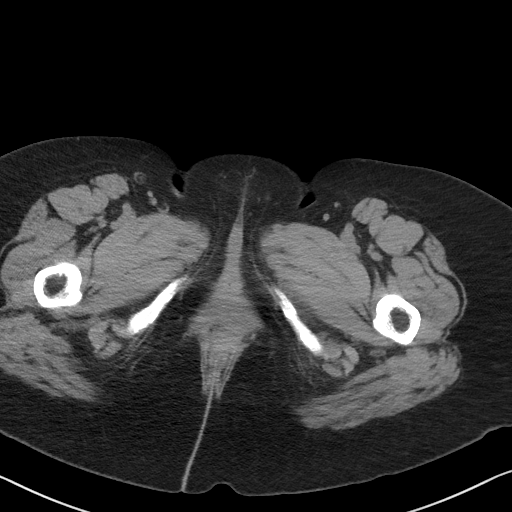
[im 4/89  bone]
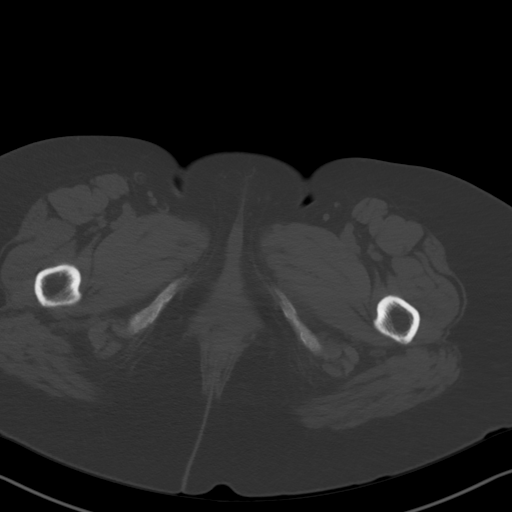
[im 11/89  soft-tissue]
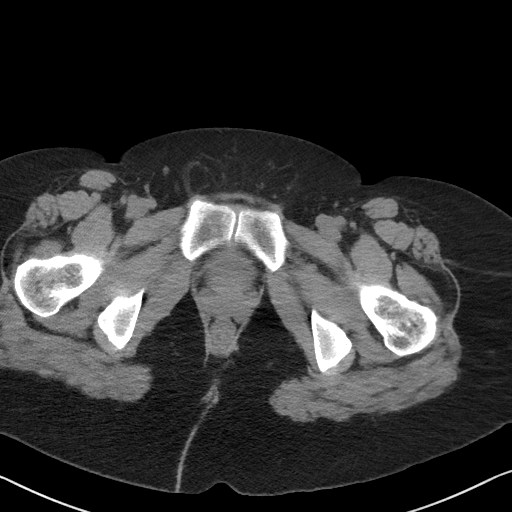
[im 18/89  soft-tissue]
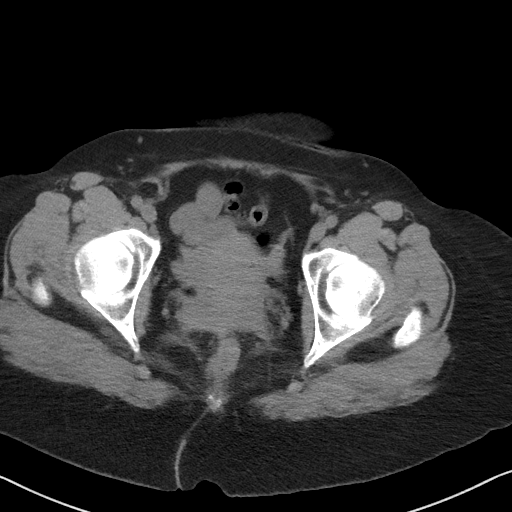
[im 25/89  soft-tissue]
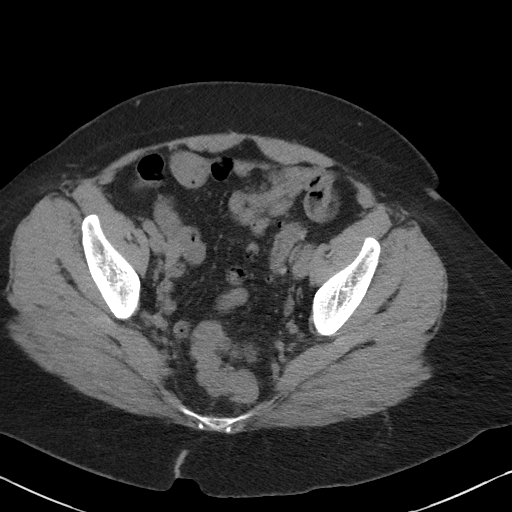
[im 32/89  soft-tissue]
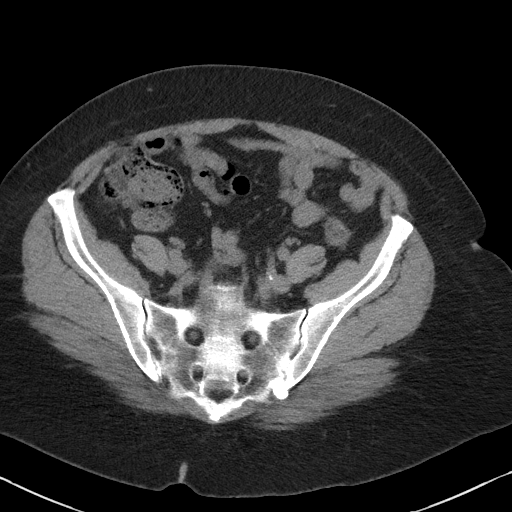
[im 39/89  soft-tissue]
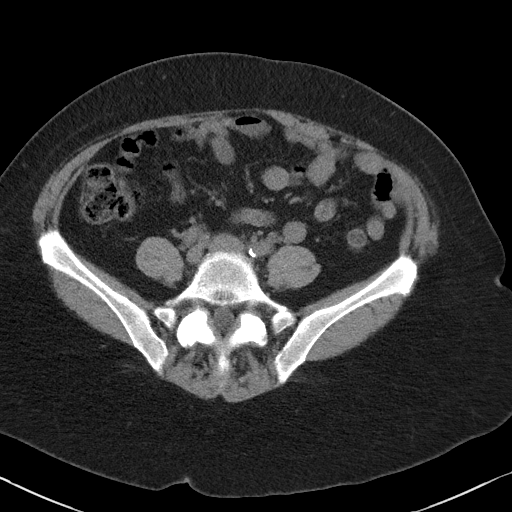
[im 46/89  soft-tissue]
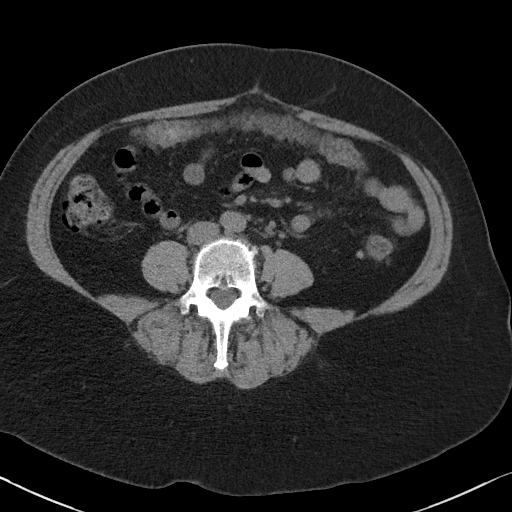
[im 50/89  soft-tissue]
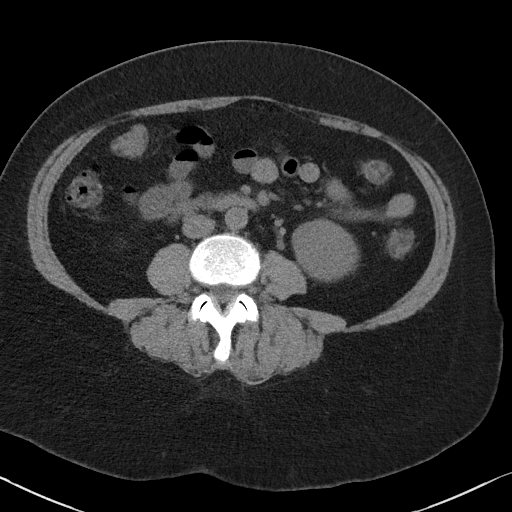
[im 57/89  soft-tissue]
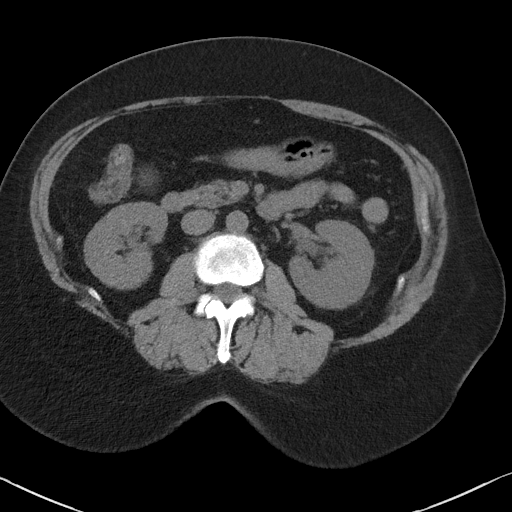
[im 57/89  bone]
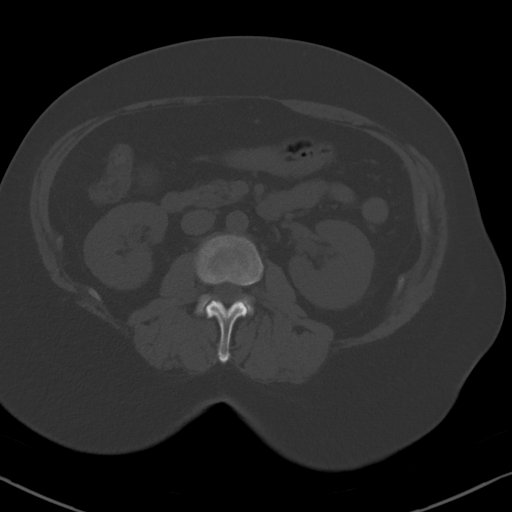
[im 64/89  soft-tissue]
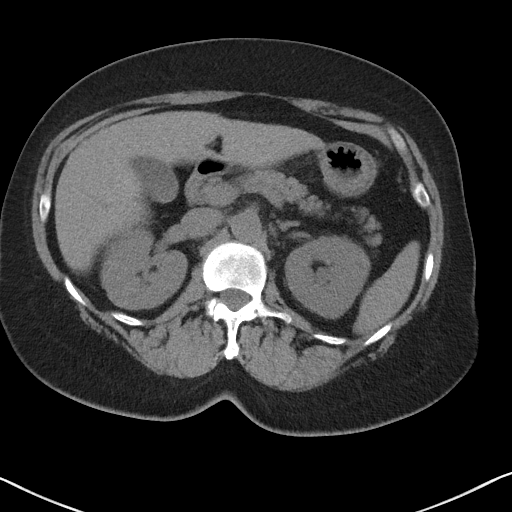
[im 71/89  soft-tissue]
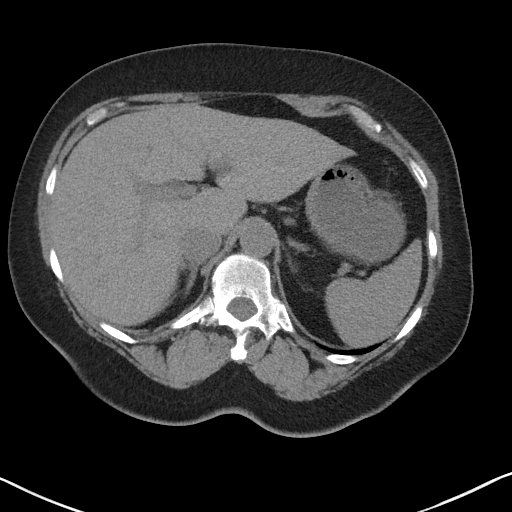
[im 78/89  soft-tissue]
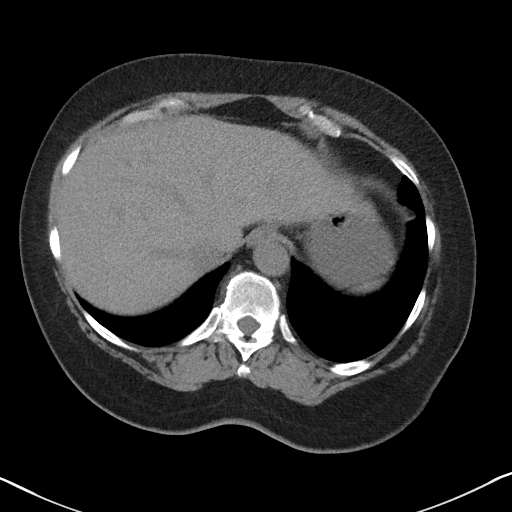
[im 85/89  soft-tissue]
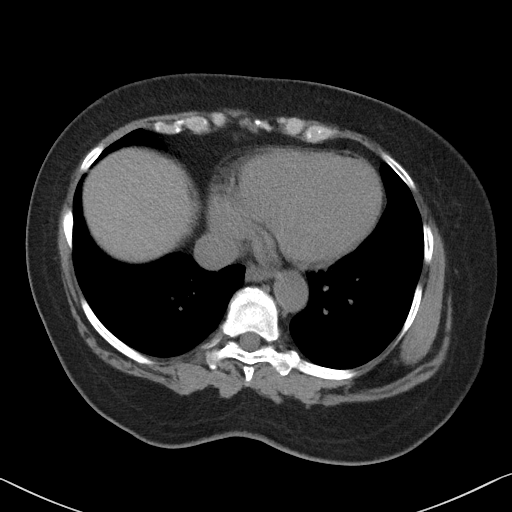

[Series 4: coronal st · coronal · 0.70mm/px · 3 of 101 slices shown]
[im 34/101  soft-tissue]
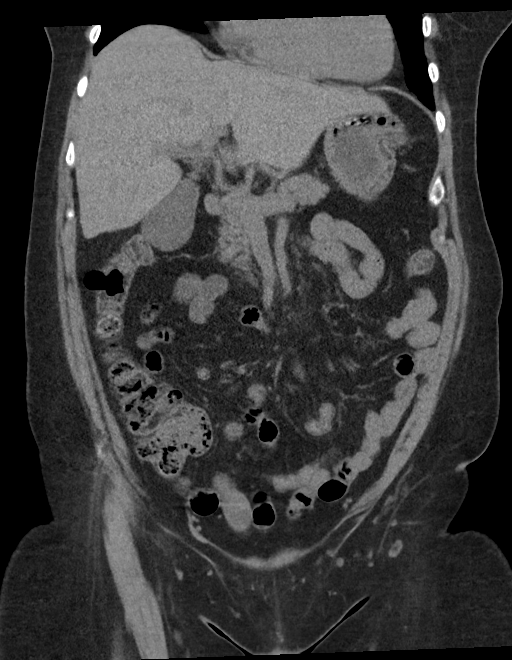
[im 45/101  soft-tissue]
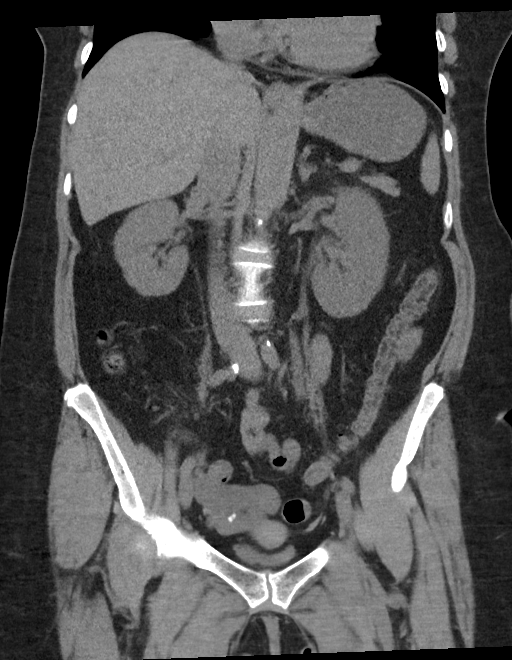
[im 56/101  soft-tissue]
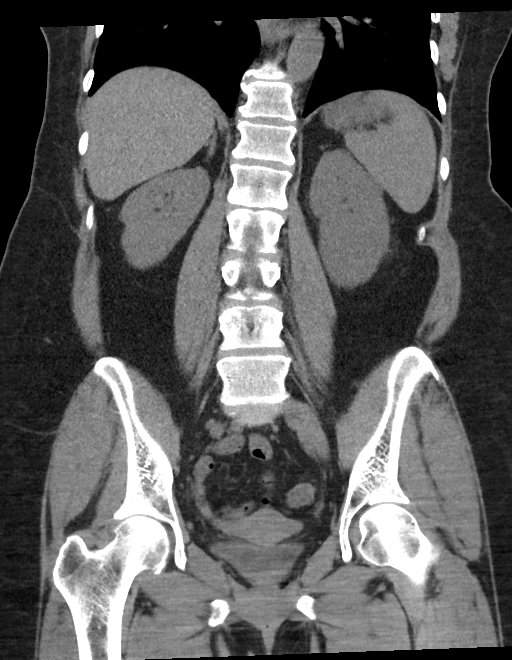

[16 of 46 positions shown; findings below may reference images not displayed]

FINDINGS: Lower chest: No acute abnormality.

Hepatobiliary: No gallstones or biliary dilatation is noted. Mildly
nodular hepatic contours are noted inferiorly suggesting possible
hepatic cirrhosis. No definite focal abnormality is seen on these
unenhanced images.

Pancreas: Unremarkable. No pancreatic ductal dilatation or
surrounding inflammatory changes.

Spleen: Normal in size without focal abnormality.

Adrenals/Urinary Tract: Adrenal glands appear normal. Right kidney
and ureter are unremarkable. Minimal left hydroureteronephrosis is
noted secondary to 5 mm calculus at the left ureterovesical
junction. Urinary bladder is decompressed.

Stomach/Bowel: Stomach appears normal. Status post appendectomy.
There is no definite evidence of bowel obstruction or inflammation.
Sigmoid diverticulosis is noted without inflammation.

Vascular/Lymphatic: No significant vascular findings are present. No
enlarged abdominal or pelvic lymph nodes.

Reproductive: Uterus and bilateral adnexa are unremarkable.

Other: Small fat containing right inguinal hernia is noted. No
ascites is noted.

Musculoskeletal: No acute or significant osseous findings.
IMPRESSION: 1. Minimal left hydroureteronephrosis is noted secondary to 5 mm
calculus at the left ureterovesical junction.
2. Mildly nodular hepatic contours are noted inferiorly suggesting
possible hepatic cirrhosis.
3. Sigmoid diverticulosis without inflammation.
4. Small fat containing right inguinal hernia.

## 2022-02-24 ENCOUNTER — Ambulatory Visit
Admission: EM | Admit: 2022-02-24 | Discharge: 2022-02-24 | Disposition: A | Discharge status: Home / Self Care | Payer: BC Managed Care – PPO

## 2022-02-24 DIAGNOSIS — H109 Unspecified conjunctivitis: Secondary | ICD-10-CM | POA: Diagnosis not present

## 2022-02-24 DIAGNOSIS — B3731 Acute candidiasis of vulva and vagina: Secondary | ICD-10-CM | POA: Diagnosis not present

## 2022-02-24 DIAGNOSIS — J018 Other acute sinusitis: Secondary | ICD-10-CM

## 2022-02-24 MED ORDER — PSEUDOEPHEDRINE HCL 60 MG PO TABS: 60.0000 mg | ORAL_TABLET | Freq: Three times a day (TID) | ORAL | 0 refills | Status: AC | PRN

## 2022-02-24 MED ORDER — PROMETHAZINE-DM 6.25-15 MG/5ML PO SYRP: 5.0000 mL | ORAL_SOLUTION | Freq: Every evening | ORAL | 0 refills | Status: AC | PRN

## 2022-02-24 MED ORDER — AMOXICILLIN-POT CLAVULANATE 875-125 MG PO TABS: 1.0000 | ORAL_TABLET | Freq: Two times a day (BID) | ORAL | 0 refills | Status: AC

## 2022-02-24 MED ORDER — CETIRIZINE HCL 10 MG PO TABS: 10.0000 mg | ORAL_TABLET | Freq: Every day | ORAL | 0 refills | Status: AC

## 2022-02-24 MED ORDER — FLUCONAZOLE 150 MG PO TABS: 150.0000 mg | ORAL_TABLET | ORAL | 0 refills | Status: AC

## 2022-02-24 NOTE — ED Provider Notes (Signed)
[name]  Note:  This document was prepared using Systems analyst and may include unintentional dictation errors.  MRN: [id number] DOB: 03-18-1965  Subjective:   Tina Lowe is a 57 y.o. female presenting for 8-day history of persistent coughing, sinus congestion and pressure, bilateral ear pain and fullness.  Has had a negative COVID test, negative strep test and negative flu test.  Patient is not having trouble with the left eye including swelling, pain redness.  The swelling has improved today.  No asthma.  No smoking.  Patient has been using supportive care.  No current facility-administered medications for this encounter.  Current Outpatient Medications:    famotidine (PEPCID) 40 MG tablet, Take 1 tablet by mouth daily., Disp: , Rfl:    cephALEXin (KEFLEX) 500 MG capsule, Take 1 capsule (500 mg total) by mouth 4 (four) times daily., Disp: 28 capsule, Rfl: 0   HYDROcodone-acetaminophen (NORCO/VICODIN) 5-325 MG tablet, Take 1 tablet by mouth every 6 (six) hours as needed., Disp: 10 tablet, Rfl: 0   ibuprofen (ADVIL,MOTRIN) 600 MG tablet, Take 1 tablet (600 mg total) by mouth every 6 (six) hours as needed., Disp: 30 tablet, Rfl: 0   lisinopril-hydrochlorothiazide (PRINZIDE,ZESTORETIC) 10-12.5 MG per tablet, Take 1 tablet by mouth daily., Disp: , Rfl:    morphine (MSIR) 15 MG tablet, Take 0.5 tablets (7.5 mg total) by mouth every 4 (four) hours as needed for severe pain., Disp: 5 tablet, Rfl: 0   ondansetron (ZOFRAN-ODT) 4 MG disintegrating tablet, 64m ODT q4 hours prn nausea/vomit, Disp: 20 tablet, Rfl: 0   tamsulosin (FLOMAX) 0.4 MG CAPS capsule, Take 1 capsule (0.4 mg total) by mouth daily after supper., Disp: 30 capsule, Rfl: 0   No Known Allergies  Past Medical History:  Diagnosis Date   Hypertension      Past Surgical History:  Procedure Laterality Date   APPENDECTOMY     TONSILLECTOMY      No family history on file.  Social  History   Tobacco Use   Smoking status: Never   Smokeless tobacco: Never  Vaping Use   Vaping Use: Never used  Substance Use Topics   Alcohol use: No   Drug use: No    ROS   Objective:   Vitals: BP (!) 150/83 (BP Location: Right Arm)   Pulse 83   Temp 99.5 F (37.5 C) (Oral)   Resp 20   LMP 06/24/2019   SpO2 95%   Physical Exam Constitutional:      General: She is not in acute distress.    Appearance: Normal appearance. She is well-developed and normal weight. She is not ill-appearing, toxic-appearing or diaphoretic.  HENT:     Head: Normocephalic and atraumatic.     Right Ear: Tympanic membrane, ear canal and external ear normal. No drainage or tenderness. No middle ear effusion. There is no impacted cerumen. Tympanic membrane is not erythematous or bulging.     Left Ear: Tympanic membrane, ear canal and external ear normal. No drainage or tenderness.  No middle ear effusion. There is no impacted cerumen. Tympanic membrane is not erythematous or bulging.     Nose: Congestion and rhinorrhea present.     Mouth/Throat:     Mouth: Mucous membranes are moist. No oral lesions.     Pharynx: No pharyngeal swelling, oropharyngeal exudate, posterior oropharyngeal erythema or uvula swelling.     Tonsils: No tonsillar exudate or tonsillar abscesses.  Eyes:     General: Lids are  normal. Lids are everted, no foreign bodies appreciated. Vision grossly intact. No scleral icterus.       Right eye: No foreign body, discharge or hordeolum.        Left eye: No foreign body, discharge or hordeolum.     Extraocular Movements: Extraocular movements intact.     Right eye: Normal extraocular motion.     Left eye: Normal extraocular motion and no nystagmus.     Conjunctiva/sclera:     Right eye: Right conjunctiva is not injected. No chemosis, exudate or hemorrhage.    Left eye: Left conjunctiva is injected (with slight drainage medially). No chemosis, exudate or hemorrhage. Cardiovascular:      Rate and Rhythm: Normal rate and regular rhythm.     Heart sounds: Normal heart sounds. No murmur heard.    No friction rub. No gallop.  Pulmonary:     Effort: Pulmonary effort is normal. No respiratory distress.     Breath sounds: No stridor. No wheezing, rhonchi or rales.  Chest:     Chest wall: No tenderness.  Musculoskeletal:     Cervical back: Normal range of motion and neck supple.  Lymphadenopathy:     Cervical: No cervical adenopathy.  Skin:    General: Skin is warm and dry.  Neurological:     General: No focal deficit present.     Mental Status: She is alert and oriented to person, place, and time.  Psychiatric:        Mood and Affect: Mood normal.        Behavior: Behavior normal.     Assessment and Plan :   PDMP not reviewed this encounter.  1. Other acute sinusitis, recurrence not specified   2. Bacterial conjunctivitis of left eye   3. Yeast vaginitis     Deferred imaging given clear cardiopulmonary exam, hemodynamically stable vital signs.  Start tobramycin to address bacterial conjunctivitis of the left eye likely secondary to sinusitis which we will manage with Augmentin.  Recommended supportive care otherwise. Counseled patient on potential for adverse effects with medications prescribed/recommended today, ER and return-to-clinic precautions discussed, patient verbalized understanding.    Tina Lowe, [place] 02/24/22 1311

## 2022-02-24 NOTE — ED Triage Notes (Signed)
Pt c/o sinus congestion/pressure,bilat earache, cough x 8 days-states she was seen at [facility] in [place]-had neg strp, neg covid, neg flu-states feels no better and left eye drainage this am-NAD-steady gait
